# Patient Record
Sex: Female | Born: 1970 | Race: White | Hispanic: No | Marital: Married | Smoking: Current every day smoker
Health system: Southern US, Community
[De-identification: ages and names within clinical notes are randomized; demographics above are authoritative.]

## PROBLEM LIST (undated history)

## (undated) DIAGNOSIS — F419 Anxiety disorder, unspecified: Secondary | ICD-10-CM

## (undated) DIAGNOSIS — K8020 Calculus of gallbladder without cholecystitis without obstruction: Secondary | ICD-10-CM

## (undated) DIAGNOSIS — K219 Gastro-esophageal reflux disease without esophagitis: Secondary | ICD-10-CM

## (undated) DIAGNOSIS — Z3040 Encounter for surveillance of contraceptives, unspecified: Secondary | ICD-10-CM

## (undated) DIAGNOSIS — F32A Depression, unspecified: Secondary | ICD-10-CM

## (undated) DIAGNOSIS — T7840XA Allergy, unspecified, initial encounter: Secondary | ICD-10-CM

## (undated) DIAGNOSIS — Z87440 Personal history of urinary (tract) infections: Secondary | ICD-10-CM

## (undated) DIAGNOSIS — J45909 Unspecified asthma, uncomplicated: Secondary | ICD-10-CM

## (undated) HISTORY — DX: Unspecified asthma, uncomplicated: J45.909

## (undated) HISTORY — PX: ANKLE SURGERY: SHX546

## (undated) HISTORY — DX: Encounter for surveillance of contraceptives, unspecified: Z30.40

## (undated) HISTORY — DX: Allergy, unspecified, initial encounter: T78.40XA

## (undated) HISTORY — PX: WISDOM TOOTH EXTRACTION: SHX21

## (undated) HISTORY — PX: BLADDER SURGERY: SHX569

## (undated) HISTORY — DX: Personal history of urinary (tract) infections: Z87.440

## (undated) HISTORY — PX: DILATION AND CURETTAGE OF UTERUS: SHX78

## (undated) HISTORY — DX: Calculus of gallbladder without cholecystitis without obstruction: K80.20

## (undated) HISTORY — DX: Gastro-esophageal reflux disease without esophagitis: K21.9

---

## 2006-11-30 ENCOUNTER — Ambulatory Visit: Payer: Self-pay | Admitting: Internal Medicine

## 2006-11-30 DIAGNOSIS — F411 Generalized anxiety disorder: Secondary | ICD-10-CM | POA: Insufficient documentation

## 2006-12-01 LAB — CONVERTED CEMR LAB
BUN: 4 mg/dL — ABNORMAL LOW (ref 6–23)
Basophils Relative: 0.7 % (ref 0.0–1.0)
CO2: 25 meq/L (ref 19–32)
GFR calc Af Amer: 122 mL/min
Glucose, Bld: 109 mg/dL — ABNORMAL HIGH (ref 70–99)
HCT: 42.6 % (ref 36.0–46.0)
Hemoglobin: 14.9 g/dL (ref 12.0–15.0)
Lymphocytes Relative: 22 % (ref 12.0–46.0)
Monocytes Absolute: 1.1 10*3/uL — ABNORMAL HIGH (ref 0.2–0.7)
Monocytes Relative: 8 % (ref 3.0–11.0)
Neutro Abs: 9.1 10*3/uL — ABNORMAL HIGH (ref 1.4–7.7)
Neutrophils Relative %: 68 % (ref 43.0–77.0)
Potassium: 3.4 meq/L — ABNORMAL LOW (ref 3.5–5.1)
Sodium: 141 meq/L (ref 135–145)

## 2006-12-12 ENCOUNTER — Encounter: Payer: Self-pay | Admitting: Internal Medicine

## 2006-12-12 ENCOUNTER — Ambulatory Visit: Payer: Self-pay

## 2006-12-19 ENCOUNTER — Ambulatory Visit: Payer: Self-pay | Admitting: Internal Medicine

## 2006-12-19 LAB — CONVERTED CEMR LAB: Beta hcg, urine, semiquantitative: NEGATIVE

## 2006-12-30 ENCOUNTER — Ambulatory Visit: Payer: Self-pay | Admitting: Internal Medicine

## 2006-12-31 ENCOUNTER — Encounter: Payer: Self-pay | Admitting: Internal Medicine

## 2007-01-02 ENCOUNTER — Ambulatory Visit: Payer: Self-pay | Admitting: Internal Medicine

## 2007-01-06 ENCOUNTER — Ambulatory Visit: Payer: Self-pay | Admitting: Internal Medicine

## 2007-01-11 ENCOUNTER — Encounter (INDEPENDENT_AMBULATORY_CARE_PROVIDER_SITE_OTHER): Payer: Self-pay | Admitting: *Deleted

## 2007-01-11 ENCOUNTER — Telehealth: Payer: Self-pay | Admitting: Internal Medicine

## 2007-01-13 ENCOUNTER — Ambulatory Visit: Payer: Self-pay | Admitting: Internal Medicine

## 2007-01-20 ENCOUNTER — Ambulatory Visit: Payer: Self-pay | Admitting: Internal Medicine

## 2007-02-20 ENCOUNTER — Ambulatory Visit: Payer: Self-pay | Admitting: Internal Medicine

## 2007-04-12 ENCOUNTER — Ambulatory Visit: Payer: Self-pay | Admitting: Internal Medicine

## 2007-04-13 ENCOUNTER — Encounter: Payer: Self-pay | Admitting: Internal Medicine

## 2007-04-21 ENCOUNTER — Ambulatory Visit: Payer: Self-pay | Admitting: Internal Medicine

## 2007-05-10 ENCOUNTER — Encounter (INDEPENDENT_AMBULATORY_CARE_PROVIDER_SITE_OTHER): Payer: Self-pay | Admitting: Obstetrics & Gynecology

## 2007-05-10 ENCOUNTER — Ambulatory Visit (HOSPITAL_COMMUNITY): Admission: RE | Admit: 2007-05-10 | Discharge: 2007-05-10 | Payer: Self-pay | Admitting: Obstetrics & Gynecology

## 2007-06-27 ENCOUNTER — Telehealth (INDEPENDENT_AMBULATORY_CARE_PROVIDER_SITE_OTHER): Payer: Self-pay | Admitting: *Deleted

## 2007-06-28 ENCOUNTER — Ambulatory Visit: Payer: Self-pay | Admitting: Internal Medicine

## 2007-06-28 ENCOUNTER — Telehealth (INDEPENDENT_AMBULATORY_CARE_PROVIDER_SITE_OTHER): Payer: Self-pay | Admitting: *Deleted

## 2007-09-18 ENCOUNTER — Telehealth (INDEPENDENT_AMBULATORY_CARE_PROVIDER_SITE_OTHER): Payer: Self-pay | Admitting: *Deleted

## 2008-01-04 ENCOUNTER — Telehealth (INDEPENDENT_AMBULATORY_CARE_PROVIDER_SITE_OTHER): Payer: Self-pay | Admitting: *Deleted

## 2008-03-30 ENCOUNTER — Inpatient Hospital Stay (HOSPITAL_COMMUNITY): Admission: AD | Admit: 2008-03-30 | Discharge: 2008-03-30 | Payer: Self-pay | Admitting: Obstetrics & Gynecology

## 2008-04-02 ENCOUNTER — Inpatient Hospital Stay (HOSPITAL_COMMUNITY): Admission: AD | Admit: 2008-04-02 | Discharge: 2008-04-03 | Payer: Self-pay | Admitting: Obstetrics and Gynecology

## 2008-07-30 ENCOUNTER — Telehealth (INDEPENDENT_AMBULATORY_CARE_PROVIDER_SITE_OTHER): Payer: Self-pay | Admitting: *Deleted

## 2010-06-29 LAB — CBC
HCT: 36.8 % (ref 36.0–46.0)
HCT: 38.1 % (ref 36.0–46.0)
Hemoglobin: 12.9 g/dL (ref 12.0–15.0)
MCHC: 34 g/dL (ref 30.0–36.0)
MCV: 89 fL (ref 78.0–100.0)
MCV: 90.1 fL (ref 78.0–100.0)
RBC: 4.08 MIL/uL (ref 3.87–5.11)
RBC: 4.28 MIL/uL (ref 3.87–5.11)
RDW: 14.1 % (ref 11.5–15.5)
WBC: 20.4 10*3/uL — ABNORMAL HIGH (ref 4.0–10.5)

## 2010-06-29 LAB — RPR: RPR Ser Ql: NONREACTIVE

## 2010-07-28 NOTE — Op Note (Signed)
Hailey Bowman, Hailey Bowman         ACCOUNT NO.:  [id number]   MEDICAL RECORD NO.:  [id number]          PATIENT TYPE:  AMB   LOCATION:  SDC                           FACILITY:  [facility]   PHYSICIAN:  Gerrit Friends. Aldona Bar, M.D.   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   PATHOLOGY:  Pending.   PROCEDURE:  Second trimester suction dilatation and extraction.   SURGEON:  Gerrit Friends. Aldona Bar, M.D.   ANESTHESIA:  Intravenous conscious sedation.   HISTORY:  This 40 year old patient, a primigravida, was seen by me  initially in the office for her first visit approximately 3-4 weeks ago.  She returned on February 23 first trimester testing and on ultrasound  was noted to have bilateral cystic hygromas.  She was sent to do  perinatal for confirmation and genetic counseling.  Decision was  ultimately made to terminate the pregnancy because of suspected  chromosomal anomaly associated with the bilateral cystic hygromas.  Tissue will be sent for chromosomal analysis.  She is taken to the  operating room now for termination of a 12-13 week intrauterine  gestation because of bilateral cystic hygromas and suspected fetal  anomaly - chromosomal anomaly.   PROCEDURE IN DETAIL:  The patient was taken to the operating room where  after satisfactory induction of intravenous conscious sedation she was  prepped and draped having been placed in the modified lithotomy position  in the short Allen stirrups.  As part of the prep the bladder was  drained of clear urine with a red rubber catheter in an in-and-out  fashion.   At this time a  speculum was placed in the vagina and a paracervical  block carried out without difficulty using 18 mL of 1% Xylocaine without  epinephrine.  Thereafter the internal os was gently and systematically  dilated to a #41 Pratt dilator without difficulty.  Thereafter using a  #14 suction curette the cavity was thoroughly gently and systematically  evacuated of all products of conception.  Blood loss was minimal.  Curettage with a large standard curette produced no additional tissue as  did additional probing with a large sponge stick.  Again resuctioning  produced no additional tissue and at this time the procedure was felt to  be complete and was terminated.  The uterus at this time was  approximately 6-8 weeks size and firm and bleeding was minimal.   All instruments were removed and the patient was ultimately taken to the  recovery room in satisfactory condition having tolerated the procedure  well.   ESTIMATED BLOOD LOSS:  About 25 mL.   COUNTS:  All counts correct x2.   PATHOLOGIC SPECIMEN:  Consisted of products of conception and chromosome  analysis will be obtained and appropriate forms were filled out.   The patient will be observed in  recovery.  She will be given a  prescription for doxycycline 100 mg to take twice daily for a total of 5  days to avoid infection and Anaprox DS one every 6-8 hours as needed for  cramping.  She will be asked to return to the office in 2-3 weeks or as  needed.  An instruction sheet will be provided at the time of discharge.  Condition on arrival in the recovery room satisfactory.      Gerrit Friends. Aldona Bar, M.D.  Electronically Signed     RMW/MEDQ  D:  05/10/2007  T:  05/10/2007  Job:  [id number]

## 2010-07-31 NOTE — Discharge Summary (Signed)
NAMEJOLIET, MALLOZZI         ACCOUNT NO.:  [id number]   MEDICAL RECORD NO.:  [id number]          PATIENT TYPE:  INP   LOCATION:  9134                          FACILITY:  [facility]   PHYSICIAN:  Ilda Mori, M.D.   DATE OF BIRTH:  [birth year]   DATE OF ADMISSION:  04/02/2008  DATE OF DISCHARGE:  04/03/2008                               DISCHARGE SUMMARY   FINAL DIAGNOSES:  1. Intrauterine pregnancy at 40 weeks' gestation.  2. Active labor.  3. Maternal fatigue.   PROCEDURE:  Vacuum-assisted vaginal delivery of a female infant with  Apgars of 8 and 9.   Delivery was performed by Dr. Ilda Mori.   COMPLICATIONS:  None.   This is a 40 year old, G2, P 0-0-1-0 presents at term in early active  labor.  The patient's antepartum course up to this point had been  uncomplicated.  The patient presents in active labor.  She dilated to  complete and complete and pushed for over an hour and half.  Because of  this maternal exhaustion, a vacuum was used to help facilitate delivery.  The patient delivered with a second contraction with vacuum assistance.  She had the delivery of a 7 pounds 15 ounces female infant with Apgars  of 8 and 9.  There was a second-degree vaginal laceration at the  introitus that was repaired.  There was some mild shoulder dystocia that  was relieved with the McRoberts and delivery of the posterior shoulder  otherwise, no complications.  The patient's postpartum course was benign  without any significant fevers.  She was felt ready for discharge on  postpartum day #1 she was sent home on a regular diet, told to decrease  activities, told to continue her vitamins, was given Motrin, told she  could use up to 60 to 100 mg every 60 hours as needed for her pain was  to follow up in our office in 6 weeks.  Instructions and precautions  were reviewed with the patient.   LABS ON DISCHARGE:  The patient had a hemoglobin of 12.3, white blood  cell count of 20.4, and  platelet count of 263,000.      Leilani Able, P.A.-C.      Ilda Mori, M.D.  Electronically Signed    MB/MEDQ  D:  05/10/2008  T:  05/10/2008  Job:  [id number]

## 2010-12-04 LAB — CBC
HCT: 43.2
Hemoglobin: 15.1 — ABNORMAL HIGH
MCV: 88.4
Platelets: 346
RDW: 13.3

## 2011-01-19 ENCOUNTER — Other Ambulatory Visit: Payer: Self-pay | Admitting: Obstetrics & Gynecology

## 2012-03-09 ENCOUNTER — Encounter: Payer: Self-pay | Admitting: Internal Medicine

## 2012-03-09 ENCOUNTER — Ambulatory Visit (INDEPENDENT_AMBULATORY_CARE_PROVIDER_SITE_OTHER): Payer: Managed Care, Other (non HMO) | Admitting: Internal Medicine

## 2012-03-09 VITALS — BP 126/84 | HR 70 | Temp 98.1°F | Wt 271.0 lb

## 2012-03-09 DIAGNOSIS — Z720 Tobacco use: Secondary | ICD-10-CM | POA: Insufficient documentation

## 2012-03-09 DIAGNOSIS — H6690 Otitis media, unspecified, unspecified ear: Secondary | ICD-10-CM | POA: Insufficient documentation

## 2012-03-09 DIAGNOSIS — J45909 Unspecified asthma, uncomplicated: Secondary | ICD-10-CM

## 2012-03-09 DIAGNOSIS — F17200 Nicotine dependence, unspecified, uncomplicated: Secondary | ICD-10-CM

## 2012-03-09 MED ORDER — AZITHROMYCIN 250 MG PO TABS: ORAL_TABLET | ORAL | Status: DC

## 2012-03-09 MED ORDER — ALBUTEROL SULFATE HFA 108 (90 BASE) MCG/ACT IN AERS: 2.0000 | INHALATION_SPRAY | Freq: Four times a day (QID) | RESPIRATORY_TRACT | Status: DC | PRN

## 2012-03-09 NOTE — Progress Notes (Signed)
  Subjective:    Patient ID: Hailey Bowman, female    DOB: [birth year], 41 y.o.   MRN: [id number]  HPI New patient, last visit more than 3 years ago. Developed URI a week ago, he had fever, headaches, she thought had the flu. In the last two days has developed right ear pain.   Past Medical History  Diagnosis Date  . Asthma   . History of UTI     as a child    Past Surgical History  Procedure Date  . Dilation and curettage of uterus    History   Social History  . Marital Status: Married    Spouse Name: N/A    Number of Children: 1  . Years of Education: N/A   Occupational History  . [employer] part time     Social History Main Topics  . Smoking status: Current Every Day Smoker  . Smokeless tobacco: Never Used     Comment: 1 ppd  . Alcohol Use: Yes     Comment: socially   . Drug Use: No  . Sexually Active: Not on file   Other Topics Concern  . Not on file   Social History Narrative   G2P1   Family History  Problem Relation Age of Onset  . Breast cancer Mother   . Colon cancer Neg Hx   . Diabetes      GF?  . CAD      GM CHF,CAD    Review of Systems She has a remote history of asthma, currently not wheezing, has some cough. He has a old ventolin and would like a refill. No nausea, vomiting, diarrhea. Needs counseling about tobacco. See assessment and plan.    Objective:   Physical Exam General -- alert, well-developed  HEENT --   left tympanic membrane normal, right tympanic membrane: Flat, red. Canal is free of discharge or swelling. Throat without redness or discharge. Nose slightly congested Lungs -- normal respiratory effort, no intercostal retractions, no accessory muscle use, and normal breath sounds.   Heart-- normal rate, regular rhythm, no murmur, and no gallop.  No lower extremity edema Psych-- Cognition and judgment appear intact. Alert and cooperative with normal attention span and concentration.  not anxious appearing and not depressed  appearing.      Assessment & Plan:

## 2012-03-09 NOTE — Assessment & Plan Note (Signed)
Findings consistent with otitis media, she is allergic to penicillin, prescribed Z-Pak

## 2012-03-09 NOTE — Patient Instructions (Addendum)
Take the antibiotic as prescribed  (zithromax) Uses inhaler as needed for asthma, if you use a healing or more often than 3 or 4 times a week let me know. Call if no better in few days Call anytime if the symptoms are severe Come back at your convenience for a checkup

## 2012-03-09 NOTE — Assessment & Plan Note (Signed)
Interested in quitting. We discussed different modalities to help her  including Wellbutrin, nicotine supplementation and Chantix. I'm somehow concerned about the fact that Chantix is  pregnancy category C and she's not taking any birth control. Plan: Information provided regards quitting provided ? nicotine supplementation

## 2012-03-09 NOTE — Assessment & Plan Note (Signed)
Currently doing okay, prescribe ventolin as needed. See instructions

## 2012-07-18 ENCOUNTER — Other Ambulatory Visit: Payer: Self-pay | Admitting: *Deleted

## 2012-07-18 ENCOUNTER — Telehealth: Payer: Self-pay | Admitting: General Practice

## 2012-07-18 MED ORDER — ALBUTEROL SULFATE HFA 108 (90 BASE) MCG/ACT IN AERS: 2.0000 | INHALATION_SPRAY | Freq: Four times a day (QID) | RESPIRATORY_TRACT | Status: DC | PRN

## 2012-07-18 NOTE — Telephone Encounter (Signed)
Insurance company states [place] needed for Ventolin. Provider and pt ok with switching to ProAir inhaler that insurance covers. Chart updated to show this [facility]

## 2012-07-18 NOTE — Telephone Encounter (Signed)
Rx sent Discuss with patient   

## 2013-04-18 ENCOUNTER — Other Ambulatory Visit: Payer: Self-pay | Admitting: Internal Medicine

## 2013-04-25 ENCOUNTER — Other Ambulatory Visit: Payer: Self-pay | Admitting: *Deleted

## 2013-04-25 MED ORDER — ALBUTEROL SULFATE HFA 108 (90 BASE) MCG/ACT IN AERS: 2.0000 | INHALATION_SPRAY | Freq: Four times a day (QID) | RESPIRATORY_TRACT | Status: DC | PRN

## 2013-08-15 ENCOUNTER — Telehealth: Payer: Self-pay | Admitting: Internal Medicine

## 2013-08-15 ENCOUNTER — Encounter: Payer: Self-pay | Admitting: Internal Medicine

## 2013-08-15 ENCOUNTER — Ambulatory Visit (INDEPENDENT_AMBULATORY_CARE_PROVIDER_SITE_OTHER): Payer: Managed Care, Other (non HMO) | Admitting: Internal Medicine

## 2013-08-15 VITALS — BP 134/84 | HR 100 | Temp 98.9°F | Wt 282.0 lb

## 2013-08-15 DIAGNOSIS — J329 Chronic sinusitis, unspecified: Secondary | ICD-10-CM

## 2013-08-15 MED ORDER — AZITHROMYCIN 250 MG PO TABS: ORAL_TABLET | ORAL | Status: DC

## 2013-08-15 MED ORDER — GENTAMICIN FORTIFIED OPHTHALMIC SOLUTION: OPHTHALMIC | Status: DC

## 2013-08-15 NOTE — Telephone Encounter (Signed)
Relevant patient education mailed to patient.  

## 2013-08-15 NOTE — Patient Instructions (Signed)
Rest, fluids , tylenol  For cough, take Mucinex DM twice a day as needed   For congestion use OTC Nasocort: 2 nasal sprays on each side of the nose daily until you feel better  Use the eye drops x 5 days  Take the antibiotic as prescribed  (zithromax)  Call if no better in few days  Call anytime if the symptoms are severe or you have problems with your vision,  the eye gets more red-swollen

## 2013-08-15 NOTE — Progress Notes (Signed)
Subjective:    Patient ID: Hailey Bowman, female    DOB: 1971/03/10, 43 y.o.   MRN: [id number]  DOS:  08/15/2013 Type of  Visit: acute  History: Symptoms started 4 days ago with a cold, runny nose, sore throat. She did have some wheezing and used albuterol as needed. Chest symptoms are better but since yesterday she is having more sinus congestion and pain particularly on the left side. This morning the left eye was swollen and red. No eye pain per se. This afternoon the eye looks better   ROS No fever or chills Mild ear congestion and discomfort on the right. No nausea, vomiting, diarrhea. Mild cough 2 days ago. Vision is normal. Not on BCP, last menstrual period about 2 weeks ago  Past Medical History  Diagnosis Date  . Asthma   . History of UTI     as a child   . Contraceptive use     condoms    Past Surgical History  Procedure Laterality Date  . Dilation and curettage of uterus      History   Social History  . Marital Status: Married    Spouse Name: N/A    Number of Children: 1  . Years of Education: N/A   Occupational History  . not working at present    Social History Main Topics  . Smoking status: Current Every Day Smoker  . Smokeless tobacco: Never Used     Comment: 1 ppd  . Alcohol Use: Yes     Comment: socially   . Drug Use: No  . Sexual Activity: Not on file   Other Topics Concern  . Not on file   Social History Narrative   G2P1        Medication List       This list is accurate as of: 08/15/13 11:59 PM.  Always use your most recent med list.               albuterol 108 (90 BASE) MCG/ACT inhaler  Commonly known as:  PROAIR HFA  Inhale 2 puffs into the lungs every 6 (six) hours as needed for wheezing or shortness of breath.     azithromycin 250 MG tablet  Commonly known as:  ZITHROMAX Z-PAK  As directed     GENTAMICIN FORTIFIED OPHTHALMIC SOLUTION  3 drops, L eye , 4 times a day x 5 days           Objective:   Physical Exam BP 134/84  Pulse 100  Temp(Src) 98.9 F (37.2 C)  Wt 282 lb (127.914 kg)  SpO2 96% General -- alert, well-developed, NAD.   HEENT-- Not pale. TMs L normal, R slt red, no d/c; throat symmetric, no redness or discharge.  Face symmetric, sinuses   tender to palpation B. Nose moderately  congested.  EOMI, PERLA  , ant chambers normal, no photiphobia noted  conjuntiva slt red on the L Lungs -- normal respiratory effort, no intercostal retractions, no accessory muscle use, and few end exp wheezes (minimal) Heart-- normal rate, regular rhythm, no murmur.  Neurologic--  alert & oriented X3. Speech normal, gait appropriate for age, strength symmetric and appropriate for age.  Psych-- Cognition and judgment appear intact. Cooperative with normal attention span and concentration. No anxious or depressed appearing.        Assessment & Plan:   Sinusitis, Symptoms consistent with sinusitis, the left eye is slightly red and  the eyes lids slightly swollen but the eye exam  itself is normal, do not suspect an acute eye problem at this time. R TM slt red but looks the same as during previous visit. Plan: Treat with Zithromax, eye drops , see instructions

## 2013-08-15 NOTE — Progress Notes (Signed)
Pre visit review using our clinic review tool, if applicable. No additional management support is needed unless otherwise documented below in the visit note. 

## 2013-08-23 ENCOUNTER — Ambulatory Visit (INDEPENDENT_AMBULATORY_CARE_PROVIDER_SITE_OTHER): Payer: Managed Care, Other (non HMO) | Admitting: Family Medicine

## 2013-08-23 ENCOUNTER — Encounter: Payer: Self-pay | Admitting: Family Medicine

## 2013-08-23 VITALS — BP 130/82 | HR 96 | Temp 98.9°F | Resp 17 | Wt 281.1 lb

## 2013-08-23 DIAGNOSIS — J309 Allergic rhinitis, unspecified: Secondary | ICD-10-CM

## 2013-08-23 NOTE — Progress Notes (Signed)
Pre visit review using our clinic review tool, if applicable. No additional management support is needed unless otherwise documented below in the visit note. 

## 2013-08-23 NOTE — Assessment & Plan Note (Signed)
No evidence of persistent or recurrent OM.  Pt w/ evidence of allergic rhinitis on PE.  Start OTC antihistamine daily.  Reviewed supportive care and red flags that should prompt return.  Pt expressed understanding and is in agreement w/ plan.

## 2013-08-23 NOTE — Patient Instructions (Addendum)
Follow up as needed Take the Zyrtec daily for 1-2 weeks to get ahead of the allergic inflammation The ear looks great! Drink plenty of fluids Tylenol as needed REST! Call with any questions or concerns Happy Early Birthday!!!

## 2013-08-23 NOTE — Progress Notes (Signed)
   Subjective:    Patient ID: Hailey Bowman, female    DOB: November 06, 1970, 43 y.o.   MRN: [id number]  HPI URI- pt saw Dr Drue Novel on 6/3 for sinusitis, R OM.  Last night noted R sided 'knot- the lymph node was really swollen'.  This worried pt that she may still have residual infxn.  sxs improved w/ tylenol.  No fevers.  Facial pain and pressure as resolved.  Not currently taking Zyrtec.   Review of Systems For ROS see HPI     Objective:   Physical Exam  Vitals reviewed. Constitutional: She appears well-developed and well-nourished. No distress.  HENT:  Head: Normocephalic and atraumatic.  Right Ear: Tympanic membrane normal.  Left Ear: Tympanic membrane normal.  Nose: Mucosal edema and rhinorrhea present. Right sinus exhibits no maxillary sinus tenderness and no frontal sinus tenderness. Left sinus exhibits no maxillary sinus tenderness and no frontal sinus tenderness.  Mouth/Throat: Mucous membranes are normal. Posterior oropharyngeal erythema (w/ PND) present.  Eyes: Conjunctivae and EOM are normal. Pupils are equal, round, and reactive to light.  Neck: Normal range of motion. Neck supple.  Cardiovascular: Normal rate, regular rhythm and normal heart sounds.   Pulmonary/Chest: Effort normal and breath sounds normal. No respiratory distress. She has no wheezes. She has no rales.  Lymphadenopathy:       Head (right side): Tonsillar (sub centimeter, non tender) adenopathy present.    She has no cervical adenopathy.          Assessment & Plan:

## 2013-10-19 ENCOUNTER — Other Ambulatory Visit: Payer: Self-pay | Admitting: Internal Medicine

## 2014-01-17 ENCOUNTER — Other Ambulatory Visit: Payer: Self-pay | Admitting: Internal Medicine

## 2014-01-17 ENCOUNTER — Telehealth: Payer: Self-pay

## 2014-01-17 NOTE — Telephone Encounter (Signed)
Hailey Bowman [phone] [facility]  [name] needs her PROAIR HFA 108 (90 BASE) MCG/ACT inhaler, hers has disappeared and she thinks that her daughter may have thrown it away. She is going to mountains this weekend and does not want to run out.

## 2014-01-17 NOTE — Telephone Encounter (Signed)
Spoke with Pt informed her that [facility]

## 2014-05-02 ENCOUNTER — Other Ambulatory Visit: Payer: Self-pay | Admitting: Internal Medicine

## 2014-06-28 ENCOUNTER — Other Ambulatory Visit: Payer: Self-pay | Admitting: Internal Medicine

## 2014-07-01 ENCOUNTER — Telehealth: Payer: Self-pay | Admitting: Internal Medicine

## 2014-07-01 ENCOUNTER — Other Ambulatory Visit: Payer: Self-pay

## 2014-07-01 MED ORDER — ALBUTEROL SULFATE HFA 108 (90 BASE) MCG/ACT IN AERS: 2.0000 | INHALATION_SPRAY | Freq: Four times a day (QID) | RESPIRATORY_TRACT | Status: DC | PRN

## 2014-07-01 NOTE — Telephone Encounter (Signed)
[facility]  

## 2014-07-01 NOTE — Telephone Encounter (Signed)
Relation to pt: self  Call back number: [phone] Pharmacy: [id number] - [place], [place] - [address] [phone] (Phone) [phone] (Fax)         Reason for call:  Pt requesting a refill albuterol (PROAIR HFA) 108 (90 BASE) MCG/ACT inhaler. Pt scheduled an appointment for 07/16/2014

## 2014-07-16 ENCOUNTER — Ambulatory Visit (INDEPENDENT_AMBULATORY_CARE_PROVIDER_SITE_OTHER): Payer: Managed Care, Other (non HMO) | Admitting: Internal Medicine

## 2014-07-16 ENCOUNTER — Encounter: Payer: Self-pay | Admitting: Internal Medicine

## 2014-07-16 VITALS — BP 124/68 | HR 87 | Temp 98.2°F | Ht 69.0 in | Wt 274.4 lb

## 2014-07-16 DIAGNOSIS — J4520 Mild intermittent asthma, uncomplicated: Secondary | ICD-10-CM | POA: Diagnosis not present

## 2014-07-16 DIAGNOSIS — Z720 Tobacco use: Secondary | ICD-10-CM

## 2014-07-16 MED ORDER — ALBUTEROL SULFATE HFA 108 (90 BASE) MCG/ACT IN AERS: 2.0000 | INHALATION_SPRAY | Freq: Four times a day (QID) | RESPIRATORY_TRACT | Status: DC | PRN

## 2014-07-16 MED ORDER — BUPROPION HCL 100 MG PO TABS: 100.0000 mg | ORAL_TABLET | Freq: Two times a day (BID) | ORAL | Status: AC

## 2014-07-16 NOTE — Progress Notes (Signed)
Pre visit review using our clinic review tool, if applicable. No additional management support is needed unless otherwise documented below in the visit note. 

## 2014-07-16 NOTE — Patient Instructions (Signed)
Quitting tobacco Start Wellbutrin: 1 tablet every day for the next 10 days, then increase to one tablet twice a day Quit tobacco 2 or 3 weeks after you start Wellbutrin You cannot take Wellbutrin if you get pregnant, get a urinary pregnancy test or  call the office immediately if you suspect you may be pregnant Please visit American Heart Association website for good information about quitting tobacco.   Asthma, Continue taking albuterol as needed, if within 2 weeks the need to use albuterol has not decrease: Please call the office   Come back in 2 months, fasting for a physical exam. Make an appointment at the front desk.

## 2014-07-16 NOTE — Progress Notes (Signed)
Subjective:    Patient ID: Hailey ShorterMichelle Amstutz, female    DOB: 16-Dec-1970, 44 y.o.   MRN: [id number]  DOS:  07/16/2014 Type of visit - description : rov Interval history:  Patient is a 44 year old female with a history of allergic rhinitis, asthma, and tobacco use in today for a follow up visit for refill on albuterol.  Patient has been using albuterol for asthma relief. Her baseline is using it once every two days, but this has increased to twice per day due to her seasonal allergies. She notes this typically happens in both spring and fall where allergies or URIs result in increased albuterol use, but that usage recedes back to baseline afterwards. She denies any increased shortness of breath or chest pain. No constitutional symptoms of infection.  Patient also expresses an interest in smoking cessation. She is currently a 1 ppd smoker and has tried various interventions in the past. She has tried e-cigarettes and nicotine patches with no relief. Patient notes mild help with Wellbutrin in the past and would like to try again.    Review of Systems  Constitutional: No fever, chills.  Respiratory: As per HPI Cardiovascular: No CP, leg swelling or palpitations GI: no nausea, vomiting, diarrhea or abdominal pain.  GU: No dysuria, gross hematuria, difficulty urinating. No urinary urgency or frequency. Allergic, immunologic: Seasonal allergic rhinitis  Past Medical History  Diagnosis Date  . Asthma   . History of UTI     as a child   . Contraceptive use     condoms    Past Surgical History  Procedure Laterality Date  . Dilation and curettage of uterus      History   Social History  . Marital Status: Married    Spouse Name: N/A  . Number of Children: 1  . Years of Education: N/A   Occupational History  . not working at present    Social History Main Topics  . Smoking status: Current Every Day Smoker  . Smokeless tobacco: Never Used     Comment: 1 ppd  . Alcohol Use: Yes       Comment: socially   . Drug Use: No  . Sexual Activity: Not on file   Other Topics Concern  . Not on file   Social History Narrative   G2P1     Family History  Problem Relation Age of Onset  . Breast cancer Mother   . Colon cancer Neg Hx   . Diabetes      GF?  . CAD      GM CHF,CAD       Medication List       This list is accurate as of: 07/16/14 11:59 PM.  Always use your most recent med list.               albuterol 108 (90 BASE) MCG/ACT inhaler  Commonly known as:  PROAIR HFA  Inhale 2 puffs into the lungs every 6 (six) hours as needed for wheezing or shortness of breath.     buPROPion 100 MG tablet  Commonly known as:  WELLBUTRIN  Take 1 tablet (100 mg total) by mouth 2 (two) times daily.           Objective:   Physical Exam BP 124/68 mmHg  Pulse 87  Temp(Src) 98.2 F (36.8 C) (Oral)  Ht 5\' 9"  (1.753 m)  Wt 274 lb 6 oz (124.456 kg)  BMI 40.50 kg/m2  SpO2 97%  LMP 07/08/2014 (Approximate)  General:   Well developed, well nourished . NAD.  HEENT:  Normocephalic . Face symmetric, atraumatic Lungs:  CTA B Normal respiratory effort, no intercostal retractions, no accessory muscle use. Heart: RRR,  no murmur.  Muscle skeletal: no pretibial edema bilaterally  Skin: Not pale. Not jaundice Neurologic:  alert & oriented X3.  Speech normal, gait appropriate for age and unassisted Psych--  Cognition and judgment appear intact.  Cooperative with normal attention span and concentration.  Behavior appropriate. No anxious or depressed appearing.       Assessment & Plan:   (Patient seen along with   Merilynn Finland, medical student)  Today , I spent more than  25  min with the patient: >50% of the time counseling regards tobacco cessation

## 2014-07-17 NOTE — Assessment & Plan Note (Signed)
Patient is currently using albuterol twice a day, which is more than ideal. Allergies are exacerbating her symptoms above her baseline  Plan: Continue Albuterol prn.  If symptoms do not improve soon we will consider prescribing an additional medication

## 2014-07-17 NOTE — Assessment & Plan Note (Signed)
amoking Cessation: Patient has tried e-cigarettes and nicotine patches in the past with no relief.  Wellbutrin is a pregnancy Class C drug and patient is aware, her current contraception is condoms, LMP a week ago.  Plan: Will Rx Wellbutrin, patient understands importance of condom usage. Patient explained usage of building up wellbutrin for 2-3 weeks and then stopping smoking.  Patient informed she can combine with nicotine patches/gum for relief.  Patient counseled on importance of identifying rituals surrounding smoking and replacing them with healthy distractions.  AHA website provided to patient as resource.  Patient instructed to return in two months for physical exam and laboratory bloodwork.

## 2015-01-10 ENCOUNTER — Other Ambulatory Visit: Payer: Self-pay | Admitting: Internal Medicine

## 2015-02-27 ENCOUNTER — Other Ambulatory Visit: Payer: Self-pay | Admitting: Internal Medicine

## 2018-10-10 ENCOUNTER — Encounter: Payer: Self-pay | Admitting: Internal Medicine

## 2019-09-17 ENCOUNTER — Other Ambulatory Visit: Payer: Self-pay

## 2019-09-17 ENCOUNTER — Encounter (HOSPITAL_BASED_OUTPATIENT_CLINIC_OR_DEPARTMENT_OTHER): Payer: Self-pay

## 2019-09-17 DIAGNOSIS — R9431 Abnormal electrocardiogram [ECG] [EKG]: Secondary | ICD-10-CM | POA: Diagnosis not present

## 2019-09-17 DIAGNOSIS — F17210 Nicotine dependence, cigarettes, uncomplicated: Secondary | ICD-10-CM | POA: Diagnosis not present

## 2019-09-17 DIAGNOSIS — J45909 Unspecified asthma, uncomplicated: Secondary | ICD-10-CM | POA: Insufficient documentation

## 2019-09-17 DIAGNOSIS — R112 Nausea with vomiting, unspecified: Secondary | ICD-10-CM | POA: Diagnosis not present

## 2019-09-17 DIAGNOSIS — K760 Fatty (change of) liver, not elsewhere classified: Secondary | ICD-10-CM | POA: Diagnosis not present

## 2019-09-17 DIAGNOSIS — R197 Diarrhea, unspecified: Secondary | ICD-10-CM | POA: Diagnosis not present

## 2019-09-17 DIAGNOSIS — R110 Nausea: Secondary | ICD-10-CM | POA: Diagnosis not present

## 2019-09-17 DIAGNOSIS — R1013 Epigastric pain: Secondary | ICD-10-CM | POA: Diagnosis not present

## 2019-09-17 DIAGNOSIS — K8020 Calculus of gallbladder without cholecystitis without obstruction: Secondary | ICD-10-CM | POA: Diagnosis not present

## 2019-09-17 LAB — CBC
HCT: 46.6 % — ABNORMAL HIGH (ref 36.0–46.0)
Hemoglobin: 15.6 g/dL — ABNORMAL HIGH (ref 12.0–15.0)
MCH: 29.8 pg (ref 26.0–34.0)
MCHC: 33.5 g/dL (ref 30.0–36.0)
MCV: 89.1 fL (ref 80.0–100.0)
Platelets: 317 10*3/uL (ref 150–400)
RBC: 5.23 MIL/uL — ABNORMAL HIGH (ref 3.87–5.11)
RDW: 14.1 % (ref 11.5–15.5)
WBC: 18.9 10*3/uL — ABNORMAL HIGH (ref 4.0–10.5)
nRBC: 0 % (ref 0.0–0.2)

## 2019-09-17 LAB — COMPREHENSIVE METABOLIC PANEL
ALT: 22 U/L (ref 0–44)
AST: 25 U/L (ref 15–41)
Albumin: 4.2 g/dL (ref 3.5–5.0)
Alkaline Phosphatase: 56 U/L (ref 38–126)
Anion gap: 13 (ref 5–15)
BUN: 16 mg/dL (ref 6–20)
CO2: 21 mmol/L — ABNORMAL LOW (ref 22–32)
Calcium: 8.6 mg/dL — ABNORMAL LOW (ref 8.9–10.3)
Chloride: 103 mmol/L (ref 98–111)
Creatinine, Ser: 0.72 mg/dL (ref 0.44–1.00)
GFR calc Af Amer: >60 mL/min (ref >60)
GFR calc non Af Amer: >60 mL/min (ref >60)
Glucose, Bld: 139 mg/dL — ABNORMAL HIGH (ref 70–99)
Potassium: 3.1 mmol/L — ABNORMAL LOW (ref 3.5–5.1)
Sodium: 137 mmol/L (ref 135–145)
Total Bilirubin: 1.4 mg/dL — ABNORMAL HIGH (ref 0.3–1.2)
Total Protein: 8.1 g/dL (ref 6.5–8.1)

## 2019-09-17 LAB — LIPASE, BLOOD: Lipase: 21 U/L (ref 11–51)

## 2019-09-17 MED ORDER — SODIUM CHLORIDE 0.9% FLUSH
3.0000 mL | Freq: Once | INTRAVENOUS | Status: DC
  Filled 2019-09-17: qty 3

## 2019-09-17 NOTE — ED Triage Notes (Addendum)
Pt c/o epigastric pain, n/v, constipation started 7/3-states she feels better then sx returned after BM-NAD-steady gait

## 2019-09-18 ENCOUNTER — Encounter (HOSPITAL_BASED_OUTPATIENT_CLINIC_OR_DEPARTMENT_OTHER): Payer: Self-pay | Admitting: *Deleted

## 2019-09-18 ENCOUNTER — Other Ambulatory Visit: Payer: Self-pay

## 2019-09-18 ENCOUNTER — Emergency Department (HOSPITAL_BASED_OUTPATIENT_CLINIC_OR_DEPARTMENT_OTHER): Payer: BC Managed Care – PPO

## 2019-09-18 ENCOUNTER — Emergency Department (HOSPITAL_BASED_OUTPATIENT_CLINIC_OR_DEPARTMENT_OTHER)
Admission: EM | Admit: 2019-09-18 | Discharge: 2019-09-18 | Disposition: A | Discharge status: Home / Self Care | Payer: BC Managed Care – PPO | Attending: Emergency Medicine | Admitting: Emergency Medicine

## 2019-09-18 ENCOUNTER — Emergency Department (HOSPITAL_BASED_OUTPATIENT_CLINIC_OR_DEPARTMENT_OTHER)
Admission: EM | Admit: 2019-09-18 | Discharge: 2019-09-18 | Disposition: A | Discharge status: Home / Self Care | Payer: BC Managed Care – PPO | Source: Home / Self Care | Attending: Emergency Medicine | Admitting: Emergency Medicine

## 2019-09-18 DIAGNOSIS — R110 Nausea: Secondary | ICD-10-CM

## 2019-09-18 DIAGNOSIS — R1013 Epigastric pain: Secondary | ICD-10-CM | POA: Diagnosis not present

## 2019-09-18 DIAGNOSIS — K8020 Calculus of gallbladder without cholecystitis without obstruction: Secondary | ICD-10-CM | POA: Diagnosis not present

## 2019-09-18 DIAGNOSIS — K760 Fatty (change of) liver, not elsewhere classified: Secondary | ICD-10-CM | POA: Diagnosis not present

## 2019-09-18 DIAGNOSIS — R112 Nausea with vomiting, unspecified: Secondary | ICD-10-CM

## 2019-09-18 HISTORY — DX: Anxiety disorder, unspecified: F41.9

## 2019-09-18 HISTORY — DX: Depression, unspecified: F32.A

## 2019-09-18 LAB — URINALYSIS, ROUTINE W REFLEX MICROSCOPIC
Glucose, UA: NEGATIVE mg/dL
Ketones, ur: 40 mg/dL — AB
Leukocytes,Ua: NEGATIVE
Nitrite: NEGATIVE
Protein, ur: NEGATIVE mg/dL
Specific Gravity, Urine: 1.025 (ref 1.005–1.030)
pH: 6 (ref 5.0–8.0)

## 2019-09-18 LAB — COMPREHENSIVE METABOLIC PANEL
ALT: 23 U/L (ref 0–44)
AST: 24 U/L (ref 15–41)
Albumin: 4.2 g/dL (ref 3.5–5.0)
Alkaline Phosphatase: 55 U/L (ref 38–126)
Anion gap: 11 (ref 5–15)
BUN: 14 mg/dL (ref 6–20)
CO2: 25 mmol/L (ref 22–32)
Calcium: 8.9 mg/dL (ref 8.9–10.3)
Chloride: 103 mmol/L (ref 98–111)
Creatinine, Ser: 0.8 mg/dL (ref 0.44–1.00)
GFR calc Af Amer: >60 mL/min (ref >60)
GFR calc non Af Amer: >60 mL/min (ref >60)
Glucose, Bld: 121 mg/dL — ABNORMAL HIGH (ref 70–99)
Potassium: 3.3 mmol/L — ABNORMAL LOW (ref 3.5–5.1)
Sodium: 139 mmol/L (ref 135–145)
Total Bilirubin: 1.6 mg/dL — ABNORMAL HIGH (ref 0.3–1.2)
Total Protein: 7.9 g/dL (ref 6.5–8.1)

## 2019-09-18 LAB — LIPASE, BLOOD: Lipase: 23 U/L (ref 11–51)

## 2019-09-18 LAB — CBC WITH DIFFERENTIAL/PLATELET
Abs Immature Granulocytes: 0.07 10*3/uL (ref 0.00–0.07)
Basophils Absolute: 0.1 10*3/uL (ref 0.0–0.1)
Basophils Relative: 1 %
Eosinophils Absolute: 0.1 10*3/uL (ref 0.0–0.5)
Eosinophils Relative: 0 %
HCT: 44.7 % (ref 36.0–46.0)
Hemoglobin: 15.1 g/dL — ABNORMAL HIGH (ref 12.0–15.0)
Immature Granulocytes: 1 %
Lymphocytes Relative: 21 %
Lymphs Abs: 3.2 10*3/uL (ref 0.7–4.0)
MCH: 30.3 pg (ref 26.0–34.0)
MCHC: 33.8 g/dL (ref 30.0–36.0)
MCV: 89.6 fL (ref 80.0–100.0)
Monocytes Absolute: 1.3 10*3/uL — ABNORMAL HIGH (ref 0.1–1.0)
Monocytes Relative: 9 %
Neutro Abs: 10.8 10*3/uL — ABNORMAL HIGH (ref 1.7–7.7)
Neutrophils Relative %: 68 %
Platelets: 297 10*3/uL (ref 150–400)
RBC: 4.99 MIL/uL (ref 3.87–5.11)
RDW: 14.2 % (ref 11.5–15.5)
WBC: 15.5 10*3/uL — ABNORMAL HIGH (ref 4.0–10.5)
nRBC: 0 % (ref 0.0–0.2)

## 2019-09-18 LAB — URINALYSIS, MICROSCOPIC (REFLEX)

## 2019-09-18 LAB — PREGNANCY, URINE: Preg Test, Ur: NEGATIVE

## 2019-09-18 IMAGING — US US ABDOMEN LIMITED
1 series · 14 of 25 positions shown · non-contrast
Comparison: None.

CLINICAL DATA: Right upper quadrant pain

EXAM:
ULTRASOUND ABDOMEN LIMITED RIGHT UPPER QUADRANT

[Series 1: us abdomen limited · 37 acquisitions, 14 frames shown]
[im 1/37]
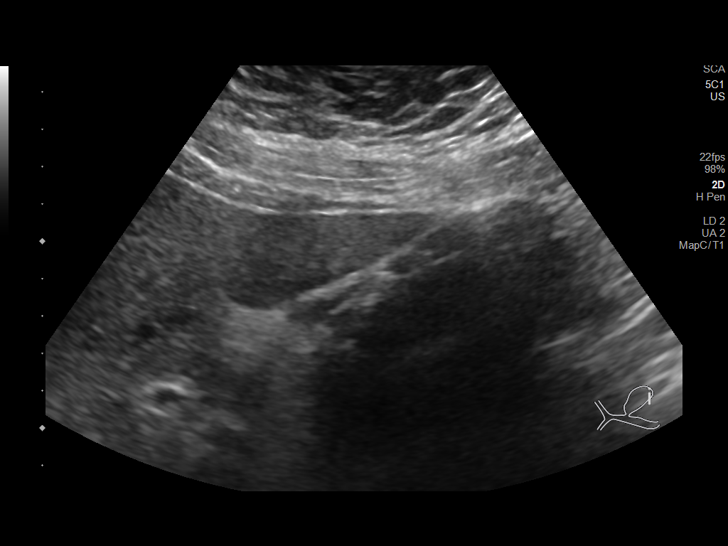
[im 4/37]
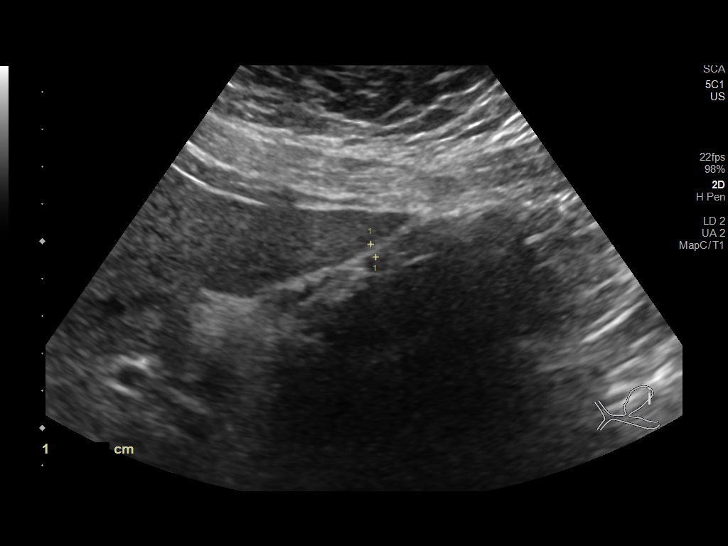
[im 7/37]
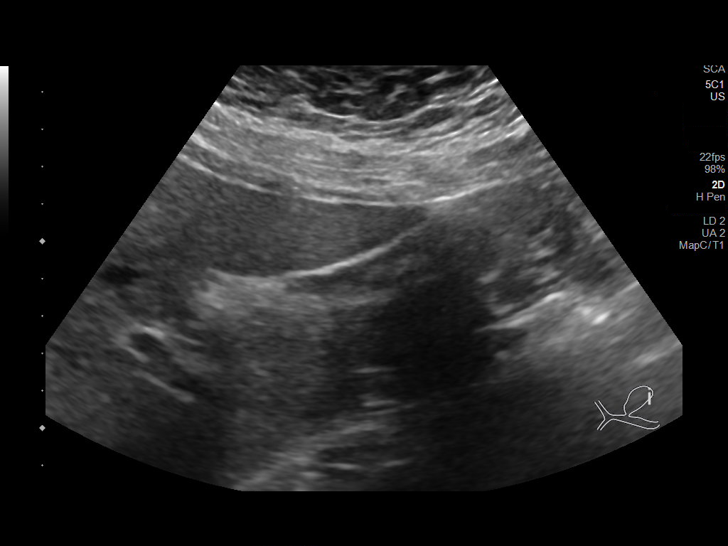
[im 10/37]
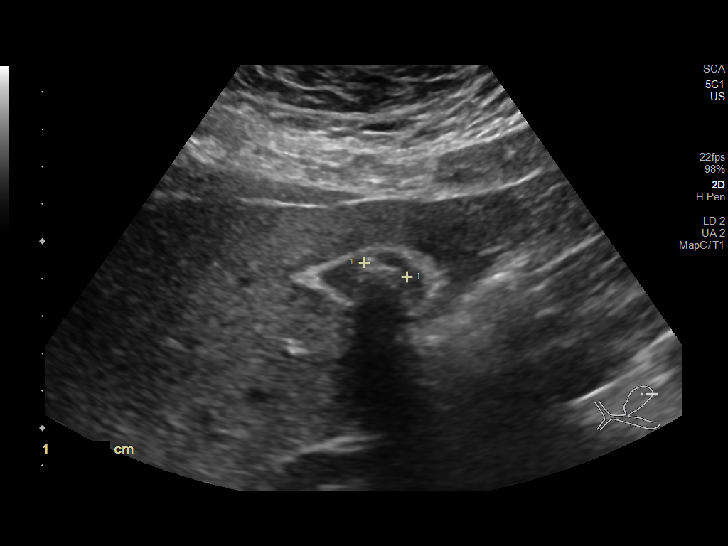
[im 13/37]
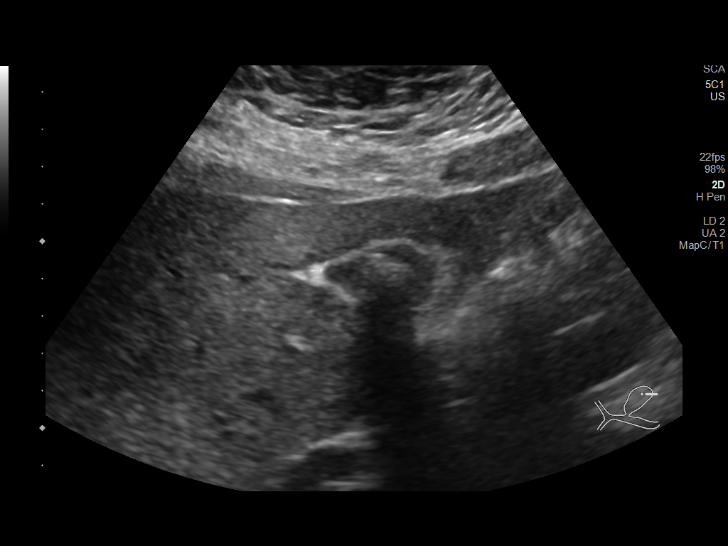
[im 14/37]
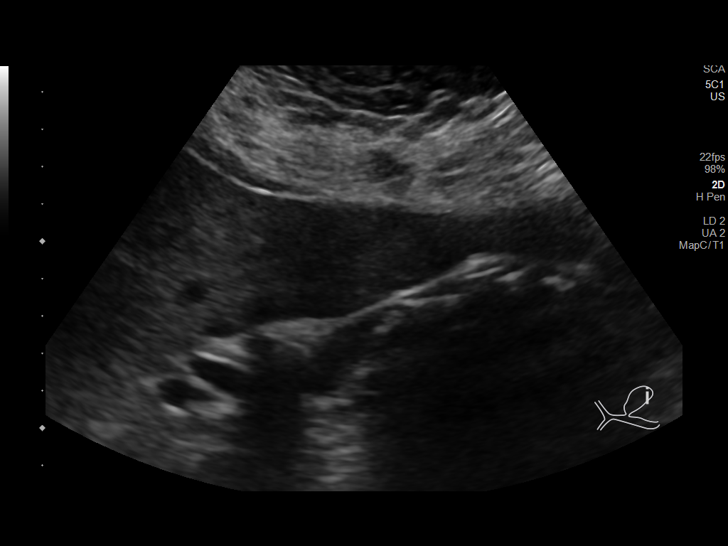
[im 17/37]
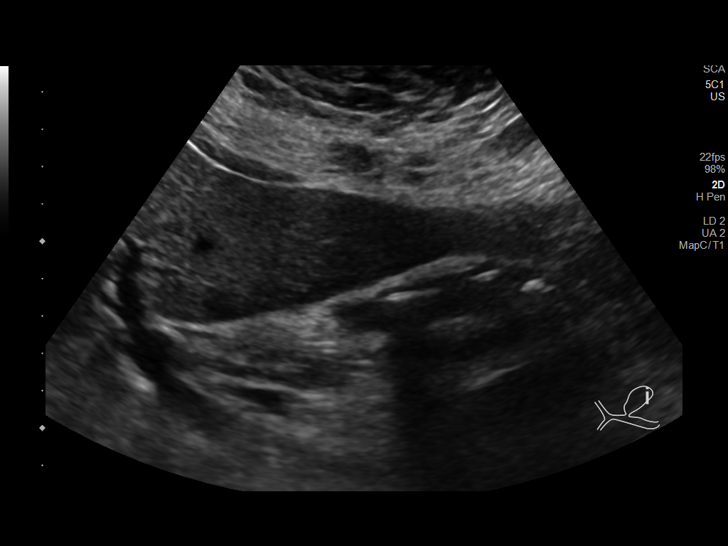
[im 20/37]
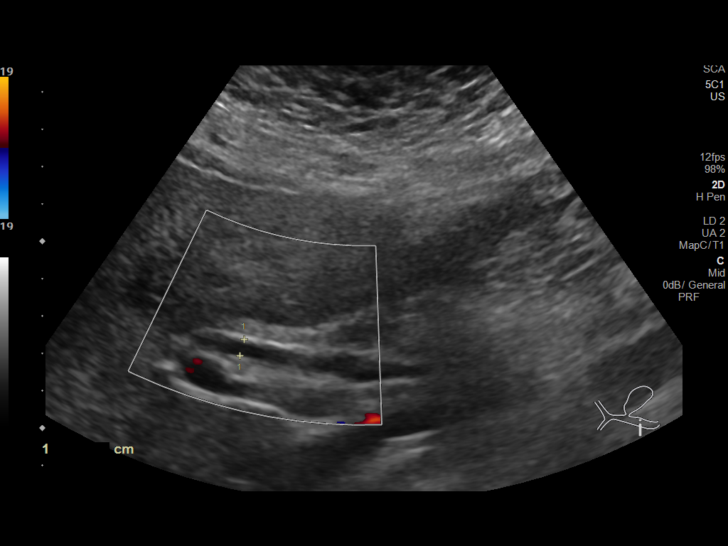
[im 23/37]
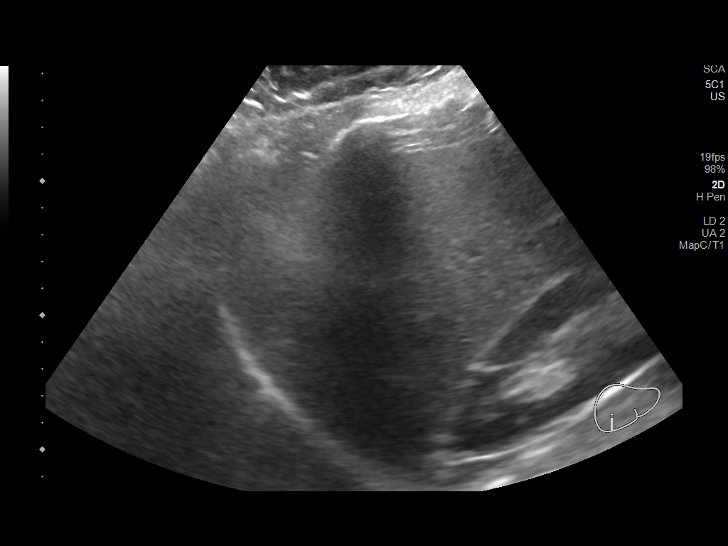
[im 25/37]
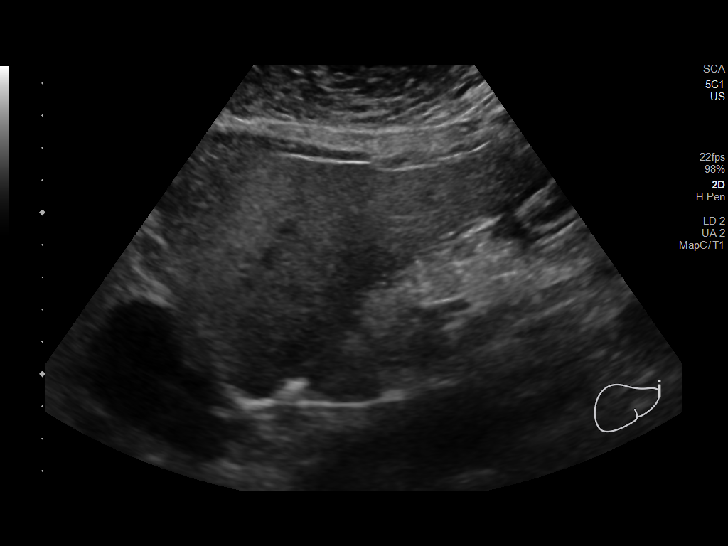
[im 28/37]
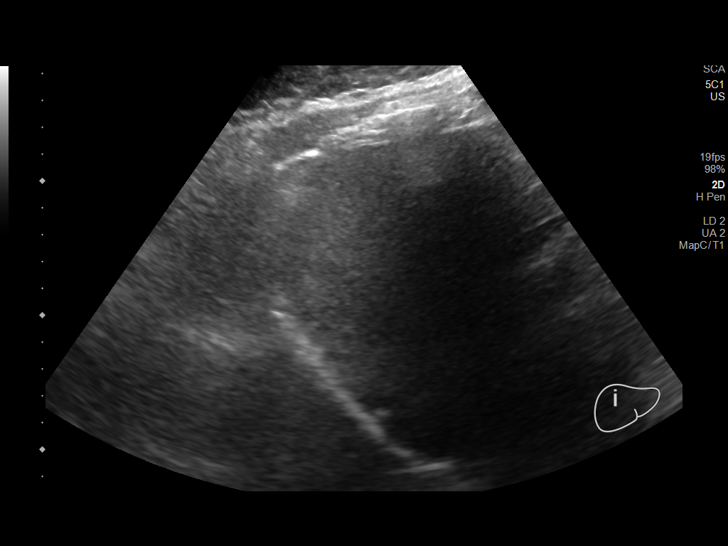
[im 31/37]
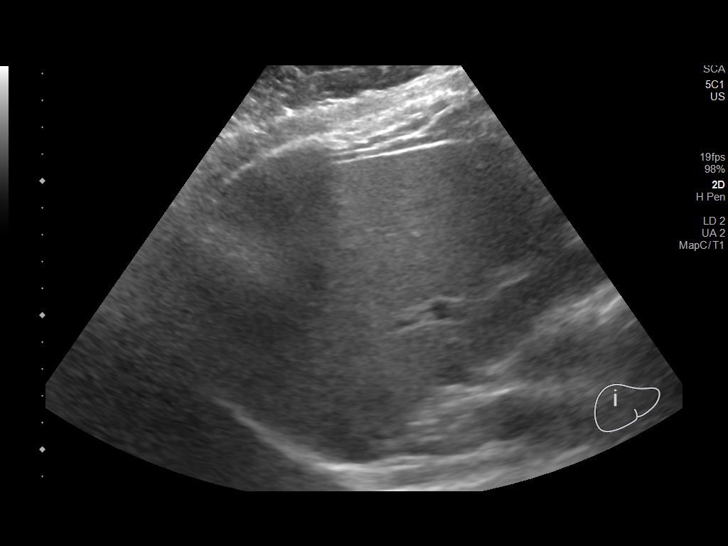
[im 34/37]
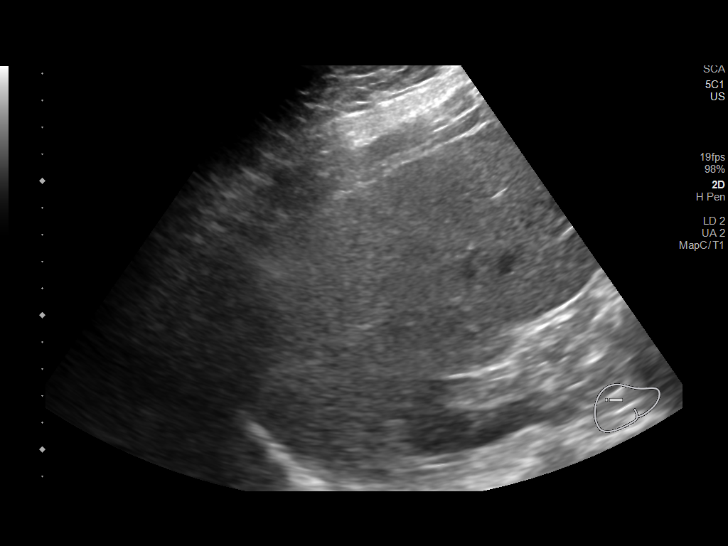
[im 37/37]
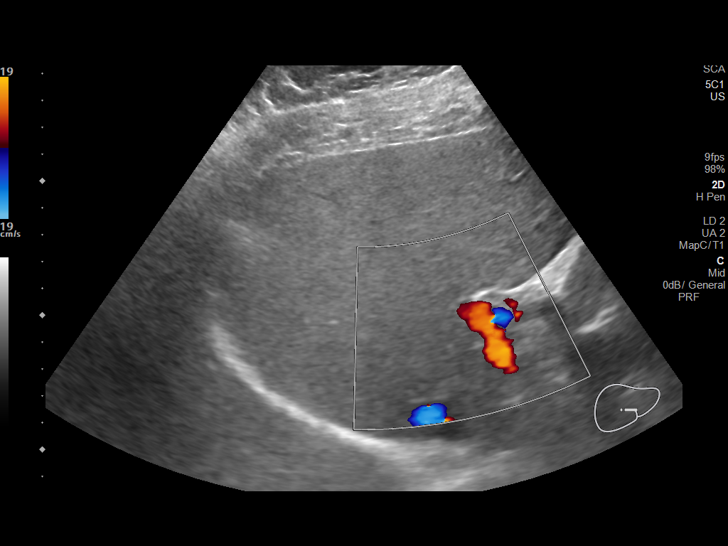

[14 of 25 positions shown; findings below may reference images not displayed]

FINDINGS: Gallbladder:

Cholelithiasis is noted. Gallbladder is decompressed contributing to
wall thickening. No true wall thickening or pericholecystic fluid is
noted.

Common bile duct:

Diameter: 4.5 mm.

Liver:

Diffuse increased echogenicity is noted consistent with fatty
infiltration. Portal vein is patent on color Doppler imaging with
normal direction of blood flow towards the liver.

Other: None.
IMPRESSION: Fatty liver.

Contracted gallbladder with cholelithiasis.

## 2019-09-18 MED ORDER — ONDANSETRON 4 MG PO TBDP: 4.0000 mg | ORAL_TABLET | Freq: Three times a day (TID) | ORAL | 0 refills | Status: DC | PRN

## 2019-09-18 MED ORDER — PANTOPRAZOLE SODIUM 20 MG PO TBEC
20.0000 mg | DELAYED_RELEASE_TABLET | Freq: Every day | ORAL | 1 refills | Status: DC
Start: 2019-09-18 — End: 2019-09-21

## 2019-09-18 MED ORDER — POTASSIUM CHLORIDE CRYS ER 20 MEQ PO TBCR: 20.0000 meq | EXTENDED_RELEASE_TABLET | Freq: Two times a day (BID) | ORAL | 0 refills | Status: DC

## 2019-09-18 MED ORDER — SODIUM CHLORIDE 0.9 % IV BOLUS
1000.0000 mL | Freq: Once | INTRAVENOUS | Status: AC
  Administered 2019-09-18: 1000 mL via INTRAVENOUS

## 2019-09-18 MED ORDER — PROMETHAZINE HCL 25 MG/ML IJ SOLN
INTRAMUSCULAR | Status: AC
  Administered 2019-09-18: 12.5 mg via INTRAVENOUS
  Filled 2019-09-18: qty 1

## 2019-09-18 MED ORDER — FAMOTIDINE 20 MG PO TABS
20.0000 mg | ORAL_TABLET | Freq: Two times a day (BID) | ORAL | 0 refills | Status: DC
Start: 2019-09-18 — End: 2019-09-21

## 2019-09-18 MED ORDER — PROMETHAZINE HCL 25 MG PO TABS
25.0000 mg | ORAL_TABLET | Freq: Four times a day (QID) | ORAL | 0 refills | Status: DC | PRN
Start: 2019-09-18 — End: 2019-09-21

## 2019-09-18 MED ORDER — PROMETHAZINE HCL 25 MG/ML IJ SOLN: 12.5000 mg | Freq: Once | INTRAMUSCULAR | Status: AC

## 2019-09-18 NOTE — ED Triage Notes (Addendum)
Cont n/v/d x 4 days seen here yesterday for same, states zofran not working

## 2019-09-18 NOTE — Progress Notes (Signed)
[facility] [address] [place], [place] [address] [phone]  Date:  09/19/2019   Name:  Hailey Bowman   DOB:  1970-03-19   MRN:  [id number]  PCP:  Wanda Plump, MD    Chief Complaint: No chief complaint on file.   History of Present Illness:  Hailey Bowman is a 49 y.o. very pleasant female patient who presents with the following:  Primary patient of my partner Dr. Drue Novel, history of allergies and asthma I have not seen her myself previously  Virtual visit today for concern of food poisoning/abdominal pain Connected with the patient via telephone, confirmed identity with 2 factors.  She gives consent for virtual visit today, the patient and myself are present on the call  Patient was seen in the ER twice on July 6-Tuesday She became ill on Saturday, ate some food would seem possibly off and shortly thereafter noted nausea, vomiting and then diarrhea She was seen in the ER early late Monday/Tuesday morning, was discharged to home with Zofran and potassium She returned to the ER in the afternoon with continued symptoms- note is still pending but pt was discharged after labs, IVF and RUQ [place]  US Abdomen Limited RUQ  Result Date: 09/18/2019 CLINICAL DATA:  Right upper quadrant pain EXAM: ULTRASOUND ABDOMEN LIMITED RIGHT UPPER QUADRANT COMPARISON:  None. FINDINGS: Gallbladder: Cholelithiasis is noted. Gallbladder is decompressed contributing to wall thickening. No true wall thickening or pericholecystic fluid is noted. Common bile duct: Diameter: 4.5 mm. Liver: Diffuse increased echogenicity is noted consistent with fatty infiltration. Portal vein is patent on color Doppler imaging with normal direction of blood flow towards the liver. Other: None. IMPRESSION: Fatty liver. Contracted gallbladder with cholelithiasis. Electronically Signed   By: Alcide Clever M.D.   On: 09/18/2019 19:10   She did have a BM this am She  feels tired out but no fever Last vomited after she took pepto about 24 hours ago She did eat some oatmeal this am and did ok  She had her covid vaccines    Patient Active Problem List   Diagnosis Date Noted  . Allergic rhinitis 08/23/2013  . Otitis media 03/09/2012  . Tobacco abuse 03/09/2012  . Asthma   . ANXIETY 11/30/2006    Past Medical History:  Diagnosis Date  . Anxiety   . Asthma   . Contraceptive use    condoms  . Depression   . History of UTI    as a child     Past Surgical History:  Procedure Laterality Date  . DILATION AND CURETTAGE OF UTERUS      Social History   Tobacco Use  . Smoking status: Current Every Day Smoker    Types: Cigarettes  . Smokeless tobacco: Never Used  . Tobacco comment: 1 ppd  Vaping Use  . Vaping Use: Never used  Substance Use Topics  . Alcohol use: Yes    Comment: occ  . Drug use: Yes    Types: Marijuana    Family History  Problem Relation Age of Onset  . Breast cancer Mother   . Diabetes Other        GF?  . CAD Other        GM CHF,CAD  . Colon cancer Neg Hx     Allergies  Allergen Reactions  . Ibuprofen Hives  . Penicillins     Medication list has been reviewed and updated.  Current  Outpatient Medications on File Prior to Visit  Medication Sig Dispense Refill  . albuterol (PROAIR HFA) 108 (90 BASE) MCG/ACT inhaler Inhale 2 puffs into the lungs every 6 (six) hours as needed for wheezing or shortness of breath. 1 Inhaler 0  . buPROPion (WELLBUTRIN) 100 MG tablet Take 1 tablet (100 mg total) by mouth 2 (two) times daily. 60 tablet 3  . ondansetron (ZOFRAN ODT) 4 MG disintegrating tablet Take 1 tablet (4 mg total) by mouth every 8 (eight) hours as needed for nausea or vomiting. 20 tablet 0  . potassium chloride SA (KLOR-CON) 20 MEQ tablet Take 1 tablet (20 mEq total) by mouth 2 (two) times daily for 3 days. 6 tablet 0   No current facility-administered medications on file prior to visit.    Review of  Systems:  As per HPI- otherwise negative.   Physical Examination: There were no vitals filed for this visit. There were no vitals filed for this visit. There is no height or weight on file to calculate BMI. Ideal Body Weight:    Patient does not have her MyChart set up yet, we are not able to get video functionality  She sounds well, no wheezing, cough, distress is noted   Assessment and Plan: Gallstones - Plan: Ambulatory referral to General Surgery  Hospital discharge follow-up  Virtual visit today to follow-up from recent ER visit x2.  Patient was thought to have possibly food poisoning, but was also found to have gallstones.  Per patient, her symptoms are now thought to be due to gallbladder colic.  She has a referral to GI but no appointment until August.  We discussed her situation, I feel it is actually more urgent for her to see general surgery.  I placed an urgent referral for her and asked her to call ASAP.  In the meantime, she will keep on a light and low-fat diet to reduce risk of gallbladder colic.  I advised her that she may need an elective cholecystectomy.  However, if she is not doing okay in the meantime-if pain is persistent or worse, vomiting or fever-she should return to the ER for immediate evaluation.  She states understanding and agreement  Telephone used for the entire visit- spoke to pt for just over 10 minutes total     Signed Abbe Amsterdam, MD

## 2019-09-18 NOTE — Discharge Instructions (Signed)
There appears to be stones in the gallbladder.  This could be causing what is known as symptomatic cholelithiasis, which simply means the gallbladder stones are causing symptoms. Your symptoms could also be due to irritation in the stomach.  Diet: Start with a clear liquid diet, progressed to a full liquid diet, and then bland solids as you are able. Please adhere to the enclosed dietary suggestions.  In general, avoid NSAIDs (i.e. ibuprofen, naproxen, etc.), caffeine, alcohol, spicy foods, fatty foods, or any other foods that seem to cause your symptoms to arise.  Protonix: Take this medication daily, 20-30 minutes prior to your first meal, for the next 8 weeks.  Continue to take this medication even if you begin to feel better.  Pepcid: Take this medication twice a day for the next 5 days.  Nausea/vomiting: Use the promethazine (generic for Phenergan) for nausea or vomiting.  This medication may not prevent all vomiting or nausea, but can help facilitate better hydration. Things that can help with nausea/vomiting also include peppermint/menthol candies, vitamin B12, and ginger.  Follow-up: Please follow-up with the gastroenterologist.  Call to make appointment.  May also follow-up with the general surgery office.  Return: Return to the ED for significantly worsening symptoms, persistent vomiting, persistent fever, abdominal pain, vomiting blood, blood in the stools, dark stools, or any other major concerns.  For prescription assistance, may try using prescription discount sites or apps, such as goodrx.com

## 2019-09-18 NOTE — Discharge Instructions (Addendum)
You were evaluated in the Emergency Department and after careful evaluation, we did not find any emergent condition requiring admission or further testing in the hospital.  Your exam/testing today was overall reassuring.  Your symptoms seem to be due to food poisoning.  Please use the Zofran medication as needed for nausea.  Please take the potassium supplementation as directed over the next 3 days.  Drink plenty of fluids at home.  Please return to the Emergency Department if you experience any worsening of your condition.  We encourage you to follow up with a primary care provider.  Thank you for allowing [place] to be a part of your care.

## 2019-09-18 NOTE — ED Notes (Signed)
PT Has Gatorade at bedside. PT attempting to take fluids now.

## 2019-09-18 NOTE — ED Provider Notes (Signed)
[facility]PT [facility] Emergency Department Provider Note MRN:  [id number]  Arrival date & time: 09/18/19     Chief Complaint   Abdominal Pain   History of Present Illness   Hailey Bowman is a 49 y.o. year-old female with a history of asthma presenting to the ED with chief complaint of abdominal pain.  Patient had a leftover meal Saturday for lunch that "tasted funny".  Shortly after began experiencing nausea, vomiting, later in the evening began experiencing diarrhea.  Had a brief time yesterday where she felt better but then the symptoms recurred.  Currently feels okay.  Denies fever, no recent travel, no recent antibiotics, no blood in the vomit or the stool.  No chest pain or shortness of breath, no headache.  Review of Systems  A complete 10 system review of systems was obtained and all systems are negative except as noted in the HPI and PMH.   Patient's Health History    Past Medical History:  Diagnosis Date  . Anxiety   . Asthma   . Contraceptive use    condoms  . Depression   . History of UTI    as a child     Past Surgical History:  Procedure Laterality Date  . DILATION AND CURETTAGE OF UTERUS      Family History  Problem Relation Age of Onset  . Breast cancer Mother   . Diabetes Other        GF?  . CAD Other        GM CHF,CAD  . Colon cancer Neg Hx     Social History   Socioeconomic History  . Marital status: Married    Spouse name: Not on file  . Number of children: 1  . Years of education: Not on file  . Highest education level: Not on file  Occupational History  . Occupation: not working at present  Tobacco Use  . Smoking status: Current Every Day Smoker    Types: Cigarettes  . Smokeless tobacco: Never Used  . Tobacco comment: 1 ppd  Vaping Use  . Vaping Use: Never used  Substance and Sexual Activity  . Alcohol use: Yes    Comment: occ  . Drug use: Yes    Types: Marijuana  . Sexual activity: Not on file  Other  Topics Concern  . Not on file  Social History Narrative   G2P1   Social Determinants of Health   Financial Resource Strain:   . Difficulty of Paying Living Expenses:   Food Insecurity:   . Worried About Programme researcher, broadcasting/film/video in the Last Year:   . Barista in the Last Year:   Transportation Needs:   . Freight forwarder (Medical):   Marland Kitchen Lack of Transportation (Non-Medical):   Physical Activity:   . Days of Exercise per Week:   . Minutes of Exercise per Session:   Stress:   . Feeling of Stress :   Social Connections:   . Frequency of Communication with Friends and Family:   . Frequency of Social Gatherings with Friends and Family:   . Attends Religious Services:   . Active Member of Clubs or Organizations:   . Attends Banker Meetings:   Marland Kitchen Marital Status:   Intimate Partner Violence:   . Fear of Current or Ex-Partner:   . Emotionally Abused:   Marland Kitchen Physically Abused:   . Sexually Abused:      Physical Exam   Vitals:  09/17/19 2308  BP: (!) 148/98  Pulse: 98  Resp: 18  Temp: 98.6 F (37 C)  SpO2: 98%    CONSTITUTIONAL: Well-appearing, NAD NEURO:  Alert and oriented x 3, no focal deficits EYES:  eyes equal and reactive ENT/NECK:  no LAD, no JVD CARDIO: Regular rate, well-perfused, normal S1 and S2 PULM:  CTAB no wheezing or rhonchi GI/GU:  normal bowel sounds, non-distended, non-tender MSK/SPINE:  No gross deformities, no edema SKIN:  no rash, atraumatic PSYCH:  Appropriate speech and behavior  *Additional and/or pertinent findings included in MDM below  Diagnostic and Interventional Summary    EKG Interpretation  Date/Time:  Monday September 17 2019 23:04:27 EDT Ventricular Rate:  88 PR Interval:  136 QRS Duration: 78 QT Interval:  394 QTC Calculation: 476 R Axis:   53 Text Interpretation: Normal sinus rhythm Normal ECG Confirmed by Kennis Carina [phone]) on 09/18/2019 1:54:55 AM      Labs Reviewed  COMPREHENSIVE METABOLIC PANEL -  Abnormal; Notable for the following components:      Result Value   Potassium 3.1 (*)    CO2 21 (*)    Glucose, Bld 139 (*)    Calcium 8.6 (*)    Total Bilirubin 1.4 (*)    All other components within normal limits  CBC - Abnormal; Notable for the following components:   WBC 18.9 (*)    RBC 5.23 (*)    Hemoglobin 15.6 (*)    HCT 46.6 (*)    All other components within normal limits  URINALYSIS, ROUTINE W REFLEX MICROSCOPIC - Abnormal; Notable for the following components:   APPearance HAZY (*)    Hgb urine dipstick MODERATE (*)    Bilirubin Urine SMALL (*)    Ketones, ur 40 (*)    All other components within normal limits  URINALYSIS, MICROSCOPIC (REFLEX) - Abnormal; Notable for the following components:   Bacteria, UA FEW (*)    All other components within normal limits  LIPASE, BLOOD  PREGNANCY, URINE    No orders to display    Medications  sodium chloride flush (NS) 0.9 % injection 3 mL (3 mLs Intravenous Not Given 09/18/19 0203)     Procedures  /  Critical Care Procedures  ED Course and Medical Decision Making  I have reviewed the triage vital signs, the nursing notes, and pertinent available records from the EMR.  Listed above are laboratory and imaging tests that I personally ordered, reviewed, and interpreted and then considered in my medical decision making (see below for details).      Suspect borne illness, reassuring vital signs, completely benign abdomen, no indication for imaging, labs reassuring, seems a bit dry, advised to push fluids, will provide a few days of potassium supplementation.    Elmer Sow. Pilar Plate, MD [facility] Emergency Medicine [facility] [email or web address]  Final Clinical Impressions(s) / ED Diagnoses     ICD-10-CM   1. Nausea vomiting and diarrhea  R11.2    R19.7     ED Discharge Orders         Ordered    ondansetron (ZOFRAN ODT) 4 MG disintegrating tablet  Every 8 hours PRN     Discontinue  Reprint      09/18/19 0204    potassium chloride SA (KLOR-CON) 20 MEQ tablet  2 times daily     Discontinue  Reprint     09/18/19 0204           Discharge Instructions Discussed with and Provided  to Patient:     Discharge Instructions     You were evaluated in the Emergency Department and after careful evaluation, we did not find any emergent condition requiring admission or further testing in the hospital.  Your exam/testing today was overall reassuring.  Your symptoms seem to be due to food poisoning.  Please use the Zofran medication as needed for nausea.  Please take the potassium supplementation as directed over the next 3 days.  Drink plenty of fluids at home.  Please return to the Emergency Department if you experience any worsening of your condition.  We encourage you to follow up with a primary care provider.  Thank you for allowing [place] to be a part of your care.       Sabas Sous, MD 09/18/19 [phone]

## 2019-09-18 NOTE — ED Provider Notes (Signed)
[facility] EMERGENCY DEPARTMENT Provider Note   CSN: [id number] Arrival date & time: 09/18/19  1540     History Chief Complaint  Patient presents with  . Emesis    Hailey Bowman is a 49 y.o. female.  HPI      Hailey Bowman is a 49 y.o. female, with a history of anxiety, asthma, presenting to the ED with continued nausea.  She has been experiencing several days of intermittent nausea.  She states she thought she could possibly have food poisoning because 4 hours after eating food leftovers, she began to have intense nausea.  She did have a couple episodes of vomiting about 2 days ago, but none since. She states the episodes of nausea will last for a few hours and then subside.  They seem to recur after having a bowel movement.  She was seen last night in the ED for the same complaint.  She states that Zofran does not seem to be effective. Although she denies abdominal pain, she does endorse a fullness sensation to the epigastric region accompanying the nausea. Denies fever/chills, diarrhea, constipation, hematochezia/melena, abdominal pain, chest pain, shortness of breath, syncope, urinary symptoms, or any other complaints.    Past Medical History:  Diagnosis Date  . Anxiety   . Asthma   . Contraceptive use    condoms  . Depression   . History of UTI    as a child     Patient Active Problem List   Diagnosis Date Noted  . Allergic rhinitis 08/23/2013  . Otitis media 03/09/2012  . Tobacco abuse 03/09/2012  . Asthma   . ANXIETY 11/30/2006    Past Surgical History:  Procedure Laterality Date  . DILATION AND CURETTAGE OF UTERUS       OB History   No obstetric history on file.     Family History  Problem Relation Age of Onset  . Breast cancer Mother   . Diabetes Other        GF?  . CAD Other        GM CHF,CAD  . Colon cancer Neg Hx     Social History   Tobacco Use  . Smoking status: Current Every Day Smoker    Types:  Cigarettes  . Smokeless tobacco: Never Used  . Tobacco comment: 1 ppd  Vaping Use  . Vaping Use: Never used  Substance Use Topics  . Alcohol use: Yes    Comment: occ  . Drug use: Yes    Types: Marijuana    Home Medications Prior to Admission medications   Medication Sig Start Date End Date Taking? Authorizing Provider  albuterol (PROAIR HFA) 108 (90 BASE) MCG/ACT inhaler Inhale 2 puffs into the lungs every 6 (six) hours as needed for wheezing or shortness of breath. 01/10/15   Wanda Plump, MD  buPROPion (WELLBUTRIN) 100 MG tablet Take 1 tablet (100 mg total) by mouth 2 (two) times daily. 07/16/14   Wanda Plump, MD  famotidine (PEPCID) 20 MG tablet Take 1 tablet (20 mg total) by mouth 2 (two) times daily for 5 days. 09/18/19 09/23/19  Adan Baehr C, PA-C  ondansetron (ZOFRAN ODT) 4 MG disintegrating tablet Take 1 tablet (4 mg total) by mouth every 8 (eight) hours as needed for nausea or vomiting. 09/18/19   Sabas Sous, MD  pantoprazole (PROTONIX) 20 MG tablet Take 1 tablet (20 mg total) by mouth daily. 09/18/19 11/17/19  Casimer Russett C, PA-C  potassium chloride SA (KLOR-CON)  20 MEQ tablet Take 1 tablet (20 mEq total) by mouth 2 (two) times daily for 3 days. 09/18/19 09/21/19  Sabas Sous, MD  promethazine (PHENERGAN) 25 MG tablet Take 1 tablet (25 mg total) by mouth every 6 (six) hours as needed for nausea or vomiting. 09/18/19   Glennie Bose C, PA-C    Allergies    Ibuprofen and Penicillins  Review of Systems   Review of Systems  Constitutional: Negative for chills, diaphoresis and fever.  Respiratory: Negative for cough and shortness of breath.   Cardiovascular: Negative for chest pain.  Gastrointestinal: Positive for nausea. Negative for abdominal pain, blood in stool, constipation, diarrhea and vomiting.  Genitourinary: Negative for dysuria, flank pain and hematuria.  Musculoskeletal: Negative for back pain.  Neurological: Negative for dizziness, syncope, weakness, numbness and headaches.    All other systems reviewed and are negative.   Physical Exam Updated Vital Signs BP (!) 169/95 (BP Location: Right Arm)   Pulse 77   Temp 98.8 F (37.1 C) (Oral)   Resp 16   SpO2 97%   Physical Exam Vitals and nursing note reviewed.  Constitutional:      General: She is not in acute distress.    Appearance: She is well-developed. She is not diaphoretic.  HENT:     Head: Normocephalic and atraumatic.     Mouth/Throat:     Mouth: Mucous membranes are moist.     Pharynx: Oropharynx is clear.  Eyes:     Conjunctiva/sclera: Conjunctivae normal.  Cardiovascular:     Rate and Rhythm: Normal rate and regular rhythm.     Pulses: Normal pulses.          Radial pulses are 2+ on the right side and 2+ on the left side.       Posterior tibial pulses are 2+ on the right side and 2+ on the left side.     Heart sounds: Normal heart sounds.     Comments: Tactile temperature in the extremities appropriate and equal bilaterally. Pulmonary:     Effort: Pulmonary effort is normal. No respiratory distress.     Breath sounds: Normal breath sounds.  Abdominal:     Palpations: Abdomen is soft.     Tenderness: There is no abdominal tenderness. There is no guarding.  Musculoskeletal:     Cervical back: Neck supple.     Right lower leg: No edema.     Left lower leg: No edema.  Lymphadenopathy:     Cervical: No cervical adenopathy.  Skin:    General: Skin is warm and dry.  Neurological:     Mental Status: She is alert.  Psychiatric:        Mood and Affect: Mood and affect normal.        Speech: Speech normal.        Behavior: Behavior normal.     ED Results / Procedures / Treatments   Labs (all labs ordered are listed, but only abnormal results are displayed) Labs Reviewed  COMPREHENSIVE METABOLIC PANEL - Abnormal; Notable for the following components:      Result Value   Potassium 3.3 (*)    Glucose, Bld 121 (*)    Total Bilirubin 1.6 (*)    All other components within normal  limits  CBC WITH DIFFERENTIAL/PLATELET - Abnormal; Notable for the following components:   WBC 15.5 (*)    Hemoglobin 15.1 (*)    Neutro Abs 10.8 (*)    Monocytes Absolute 1.3 (*)  All other components within normal limits  LIPASE, BLOOD    EKG None  Radiology US Abdomen Limited RUQ  Result Date: 09/18/2019 CLINICAL DATA:  Right upper quadrant pain EXAM: ULTRASOUND ABDOMEN LIMITED RIGHT UPPER QUADRANT COMPARISON:  None. FINDINGS: Gallbladder: Cholelithiasis is noted. Gallbladder is decompressed contributing to wall thickening. No true wall thickening or pericholecystic fluid is noted. Common bile duct: Diameter: 4.5 mm. Liver: Diffuse increased echogenicity is noted consistent with fatty infiltration. Portal vein is patent on color Doppler imaging with normal direction of blood flow towards the liver. Other: None. IMPRESSION: Fatty liver. Contracted gallbladder with cholelithiasis. Electronically Signed   By: Alcide Clever M.D.   On: 09/18/2019 19:10    Procedures Procedures (including critical care time)  Medications Ordered in ED Medications  sodium chloride 0.9 % bolus 1,000 mL (0 mLs Intravenous Stopped 09/18/19 2017)  promethazine (PHENERGAN) injection 12.5 mg (12.5 mg Intravenous Given 09/18/19 1829)    ED Course  I have reviewed the triage vital signs and the nursing notes.  Pertinent labs & imaging results that were available during my care of the patient were reviewed by me and considered in my medical decision making (see chart for details).    MDM Rules/Calculators/A&P                          Patient presents with episodic nausea. Patient is nontoxic appearing, afebrile, not tachycardic, not tachypneic, not hypotensive, maintains excellent SPO2 on room air, and is in no apparent distress.   I have reviewed the patient's chart to obtain more information.   I reviewed and interpreted the patient's labs and radiological studies. Patient did not have any vomiting or even  instances of nausea during her time in the ED.  She continued to be pain-free throughout her ED course. Her leukocytosis that was noted last night has improved today. She has also had improvement in her hypokalemia. RUQ ultrasound shows cholelithiasis.  Patient symptoms could possibly be due to symptomatic cholelithiasis.  GI versus general surgery follow-up.  The patient was given instructions for home care as well as return precautions. Patient voices understanding of these instructions, accepts the plan, and is comfortable with discharge.   Findings and plan of care discussed with Vanetta Mulders, MD.   Vitals:   09/18/19 1549 09/18/19 1754 09/18/19 2011  BP: (!) 169/95 (!) 149/94 (!) 157/100  Pulse: 77 89 67  Resp: 16 18 18   Temp: 98.8 F (37.1 C)    TempSrc: Oral    SpO2: 97% 100% 100%    Final Clinical Impression(s) / ED Diagnoses Final diagnoses:  Epigastric pain  Nausea    Rx / DC Orders ED Discharge Orders         Ordered    Ambulatory referral to Gastroenterology     Discontinue  Reprint     09/18/19 1955    promethazine (PHENERGAN) 25 MG tablet  Every 6 hours PRN     Discontinue  Reprint     09/18/19 1958    pantoprazole (PROTONIX) 20 MG tablet  Daily     Discontinue  Reprint     09/18/19 1958    famotidine (PEPCID) 20 MG tablet  2 times daily     Discontinue  Reprint     09/18/19 1958           11/19/19, PA-C 09/20/19 0540    11/21/19, MD 09/22/19 1514

## 2019-09-19 ENCOUNTER — Encounter (HOSPITAL_COMMUNITY): Payer: Self-pay

## 2019-09-19 ENCOUNTER — Telehealth (INDEPENDENT_AMBULATORY_CARE_PROVIDER_SITE_OTHER): Payer: BC Managed Care – PPO | Admitting: Family Medicine

## 2019-09-19 ENCOUNTER — Observation Stay (HOSPITAL_COMMUNITY)
Admission: EM | Admit: 2019-09-19 | Discharge: 2019-09-21 | Disposition: A | Discharge status: Home / Self Care | Payer: BC Managed Care – PPO | Attending: General Surgery | Admitting: General Surgery

## 2019-09-19 ENCOUNTER — Telehealth: Payer: BC Managed Care – PPO | Admitting: Family Medicine

## 2019-09-19 ENCOUNTER — Encounter: Payer: Self-pay | Admitting: Family Medicine

## 2019-09-19 ENCOUNTER — Encounter: Payer: Self-pay | Admitting: Physician Assistant

## 2019-09-19 ENCOUNTER — Other Ambulatory Visit: Payer: Self-pay

## 2019-09-19 DIAGNOSIS — Z09 Encounter for follow-up examination after completed treatment for conditions other than malignant neoplasm: Secondary | ICD-10-CM | POA: Diagnosis not present

## 2019-09-19 DIAGNOSIS — R52 Pain, unspecified: Secondary | ICD-10-CM

## 2019-09-19 DIAGNOSIS — K819 Cholecystitis, unspecified: Secondary | ICD-10-CM | POA: Diagnosis not present

## 2019-09-19 DIAGNOSIS — Z03818 Encounter for observation for suspected exposure to other biological agents ruled out: Secondary | ICD-10-CM | POA: Diagnosis not present

## 2019-09-19 DIAGNOSIS — K915 Postcholecystectomy syndrome: Secondary | ICD-10-CM | POA: Diagnosis not present

## 2019-09-19 DIAGNOSIS — R1011 Right upper quadrant pain: Secondary | ICD-10-CM | POA: Diagnosis not present

## 2019-09-19 DIAGNOSIS — Z79899 Other long term (current) drug therapy: Secondary | ICD-10-CM | POA: Insufficient documentation

## 2019-09-19 DIAGNOSIS — F17210 Nicotine dependence, cigarettes, uncomplicated: Secondary | ICD-10-CM | POA: Insufficient documentation

## 2019-09-19 DIAGNOSIS — K810 Acute cholecystitis: Secondary | ICD-10-CM | POA: Diagnosis present

## 2019-09-19 DIAGNOSIS — Z20822 Contact with and (suspected) exposure to covid-19: Secondary | ICD-10-CM | POA: Insufficient documentation

## 2019-09-19 DIAGNOSIS — Z419 Encounter for procedure for purposes other than remedying health state, unspecified: Secondary | ICD-10-CM

## 2019-09-19 DIAGNOSIS — K8020 Calculus of gallbladder without cholecystitis without obstruction: Secondary | ICD-10-CM

## 2019-09-19 LAB — URINALYSIS, ROUTINE W REFLEX MICROSCOPIC

## 2019-09-19 LAB — CBC
HCT: 44.4 % (ref 36.0–46.0)
Hemoglobin: 15.2 g/dL — ABNORMAL HIGH (ref 12.0–15.0)
MCH: 29.8 pg (ref 26.0–34.0)
MCHC: 34.2 g/dL (ref 30.0–36.0)
MCV: 87.1 fL (ref 80.0–100.0)
Platelets: 332 10*3/uL (ref 150–400)
RBC: 5.1 MIL/uL (ref 3.87–5.11)
RDW: 13.8 % (ref 11.5–15.5)
WBC: 16 10*3/uL — ABNORMAL HIGH (ref 4.0–10.5)
nRBC: 0 % (ref 0.0–0.2)

## 2019-09-19 LAB — URINALYSIS, MICROSCOPIC (REFLEX)
RBC / HPF: >50 RBC/hpf (ref 0–5)
WBC, UA: >50 WBC/hpf (ref 0–5)

## 2019-09-19 LAB — COMPREHENSIVE METABOLIC PANEL
ALT: 23 U/L (ref 0–44)
AST: 26 U/L (ref 15–41)
Albumin: 4 g/dL (ref 3.5–5.0)
Alkaline Phosphatase: 61 U/L (ref 38–126)
Anion gap: 15 (ref 5–15)
BUN: 8 mg/dL (ref 6–20)
CO2: 18 mmol/L — ABNORMAL LOW (ref 22–32)
Calcium: 8.8 mg/dL — ABNORMAL LOW (ref 8.9–10.3)
Chloride: 106 mmol/L (ref 98–111)
Creatinine, Ser: 0.82 mg/dL (ref 0.44–1.00)
GFR calc Af Amer: >60 mL/min (ref >60)
GFR calc non Af Amer: >60 mL/min (ref >60)
Glucose, Bld: 122 mg/dL — ABNORMAL HIGH (ref 70–99)
Potassium: 3.4 mmol/L — ABNORMAL LOW (ref 3.5–5.1)
Sodium: 139 mmol/L (ref 135–145)
Total Bilirubin: 1.5 mg/dL — ABNORMAL HIGH (ref 0.3–1.2)
Total Protein: 7.8 g/dL (ref 6.5–8.1)

## 2019-09-19 LAB — LIPASE, BLOOD: Lipase: 24 U/L (ref 11–51)

## 2019-09-19 LAB — I-STAT BETA HCG BLOOD, ED (MC, WL, AP ONLY): I-stat hCG, quantitative: <5 m[IU]/mL (ref <5)

## 2019-09-19 MED ORDER — FENTANYL CITRATE (PF) 100 MCG/2ML IJ SOLN
50.0000 ug | INTRAMUSCULAR | Status: DC | PRN
  Administered 2019-09-19: 50 ug via INTRAVENOUS
  Filled 2019-09-19: qty 2

## 2019-09-19 NOTE — ED Triage Notes (Signed)
Pt arrives POV for eval of RUQ abd pain. Pt has been seen for RUQ abd pain twice in the last 2 days, and was determined to have gallstones. Pt appears uncomfortable in triage.

## 2019-09-20 ENCOUNTER — Other Ambulatory Visit (HOSPITAL_COMMUNITY): Payer: BC Managed Care – PPO

## 2019-09-20 ENCOUNTER — Emergency Department (HOSPITAL_COMMUNITY): Payer: BC Managed Care – PPO | Admitting: Anesthesiology

## 2019-09-20 ENCOUNTER — Encounter (HOSPITAL_COMMUNITY): Payer: Self-pay | Admitting: Anesthesiology

## 2019-09-20 ENCOUNTER — Emergency Department (HOSPITAL_COMMUNITY): Payer: BC Managed Care – PPO

## 2019-09-20 ENCOUNTER — Encounter (HOSPITAL_COMMUNITY)
Admission: EM | Disposition: A | Discharge status: Home / Self Care | Payer: Self-pay | Source: Home / Self Care | Attending: Emergency Medicine

## 2019-09-20 DIAGNOSIS — K8020 Calculus of gallbladder without cholecystitis without obstruction: Secondary | ICD-10-CM | POA: Diagnosis not present

## 2019-09-20 DIAGNOSIS — J309 Allergic rhinitis, unspecified: Secondary | ICD-10-CM | POA: Diagnosis not present

## 2019-09-20 DIAGNOSIS — K810 Acute cholecystitis: Secondary | ICD-10-CM | POA: Diagnosis present

## 2019-09-20 DIAGNOSIS — K819 Cholecystitis, unspecified: Secondary | ICD-10-CM | POA: Diagnosis not present

## 2019-09-20 DIAGNOSIS — K8010 Calculus of gallbladder with chronic cholecystitis without obstruction: Secondary | ICD-10-CM | POA: Diagnosis not present

## 2019-09-20 DIAGNOSIS — F419 Anxiety disorder, unspecified: Secondary | ICD-10-CM | POA: Diagnosis not present

## 2019-09-20 DIAGNOSIS — K915 Postcholecystectomy syndrome: Secondary | ICD-10-CM | POA: Diagnosis not present

## 2019-09-20 DIAGNOSIS — J45909 Unspecified asthma, uncomplicated: Secondary | ICD-10-CM | POA: Diagnosis not present

## 2019-09-20 HISTORY — PX: CHOLECYSTECTOMY: SHX55

## 2019-09-20 LAB — URINALYSIS, ROUTINE W REFLEX MICROSCOPIC
Bacteria, UA: NONE SEEN
Bilirubin Urine: NEGATIVE
Glucose, UA: NEGATIVE mg/dL
Ketones, ur: NEGATIVE mg/dL
Leukocytes,Ua: NEGATIVE
Nitrite: NEGATIVE
Protein, ur: NEGATIVE mg/dL
Specific Gravity, Urine: 1.006 (ref 1.005–1.030)
pH: 6 (ref 5.0–8.0)

## 2019-09-20 LAB — SARS CORONAVIRUS 2 BY RT PCR (HOSPITAL ORDER, PERFORMED IN ~~LOC~~ HOSPITAL LAB): SARS Coronavirus 2: NEGATIVE

## 2019-09-20 IMAGING — US US ABDOMEN LIMITED
1 series · 14 of 25 positions shown · non-contrast
Comparison: Abdominal ultrasound [DATE]

CLINICAL DATA: Abdominal pain since [REDACTED].

EXAM:
ULTRASOUND ABDOMEN LIMITED RIGHT UPPER QUADRANT

[Series 1: us abdomen limited ruq · 14 of 48 slices shown]
[im 1/48]
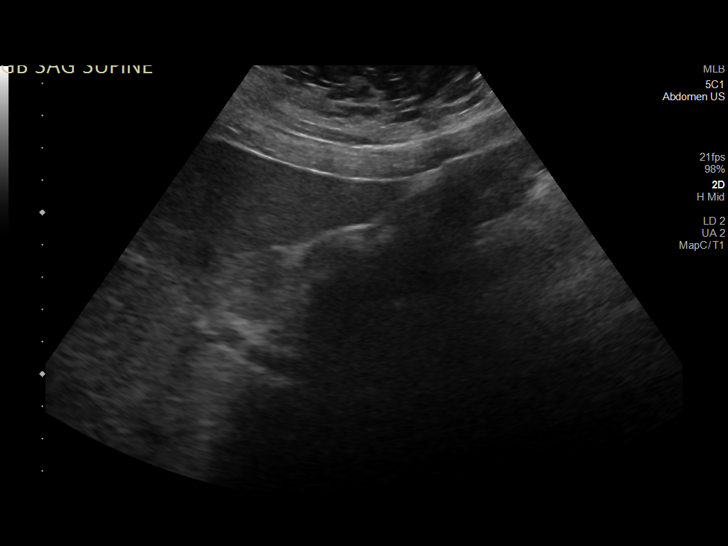
[im 4/48]
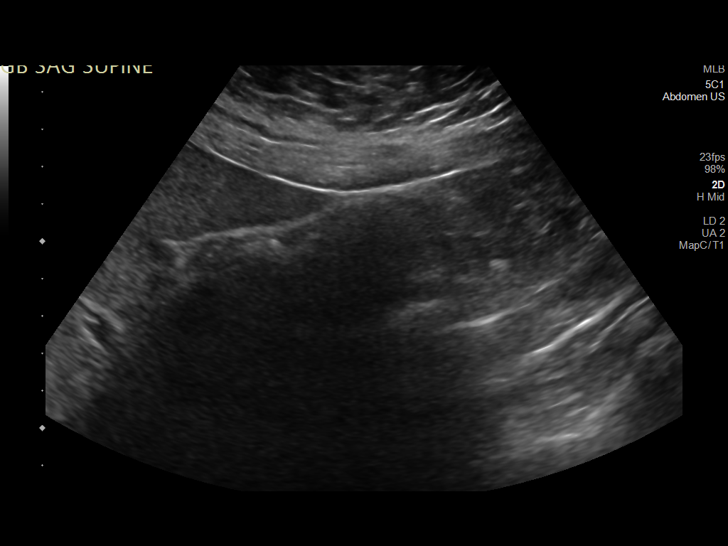
[im 8/48]
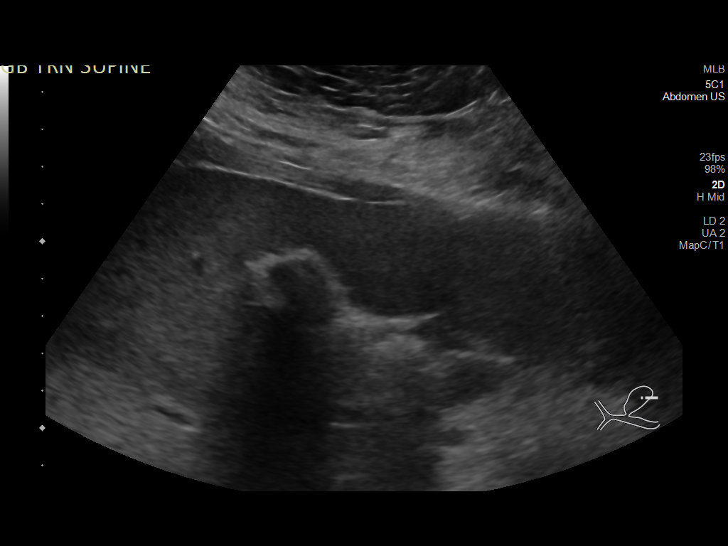
[im 12/48]
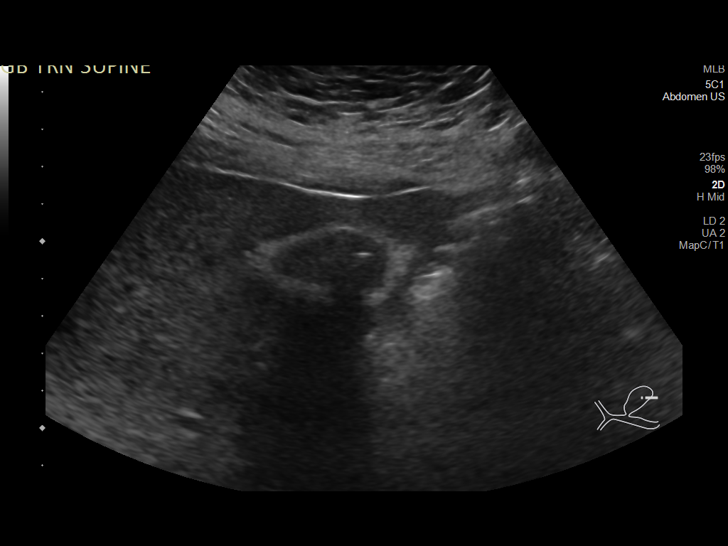
[im 16/48]
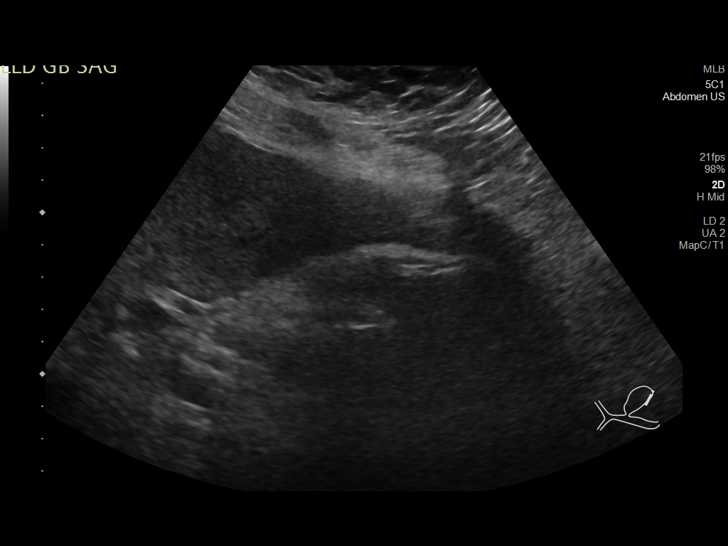
[im 18/48]
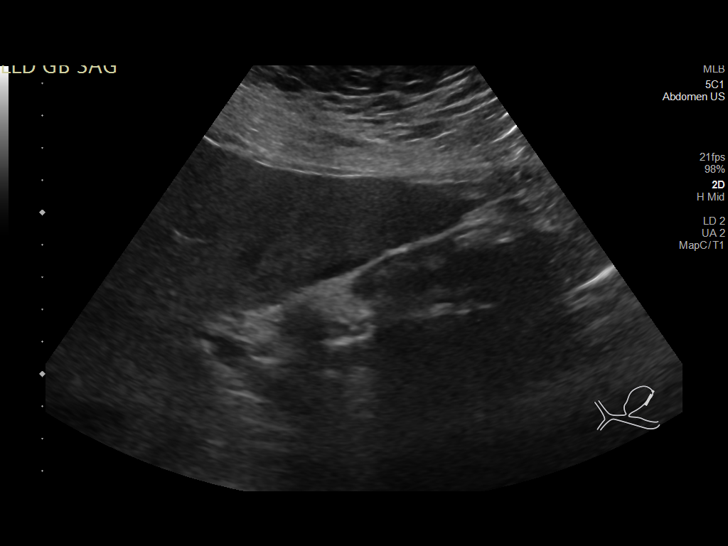
[im 22/48]
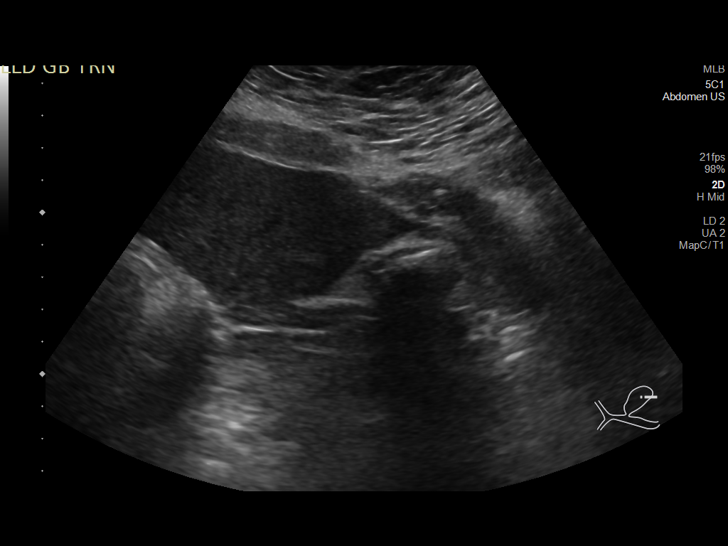
[im 26/48]
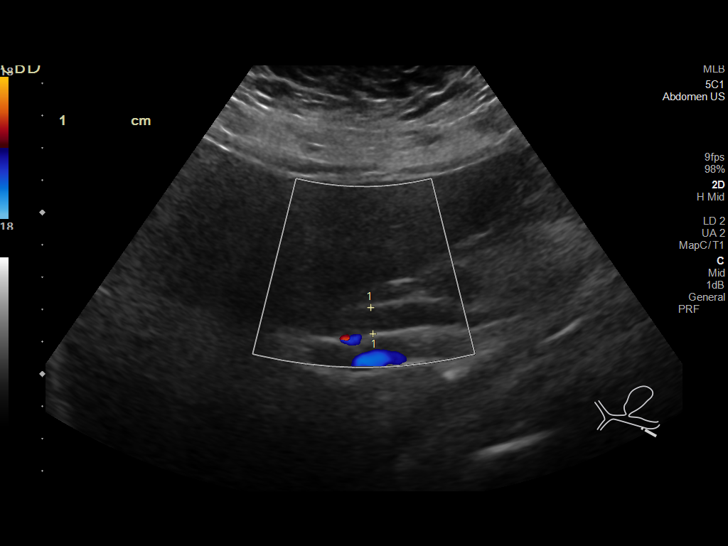
[im 30/48]
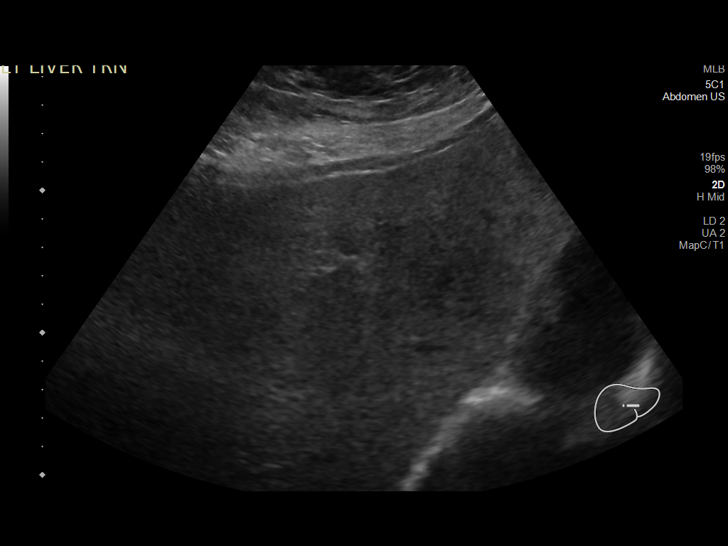
[im 32/48]
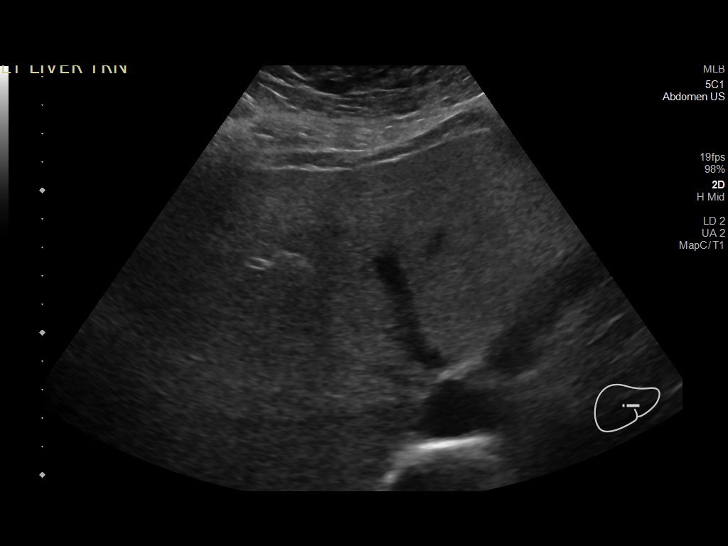
[im 36/48]
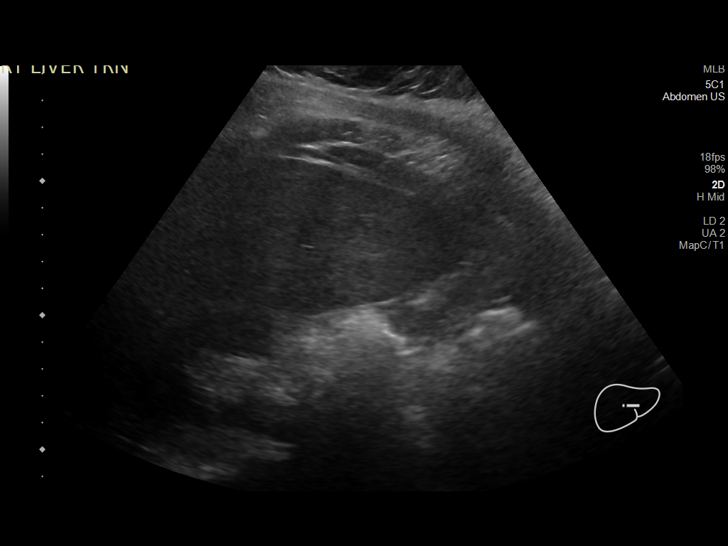
[im 40/48]
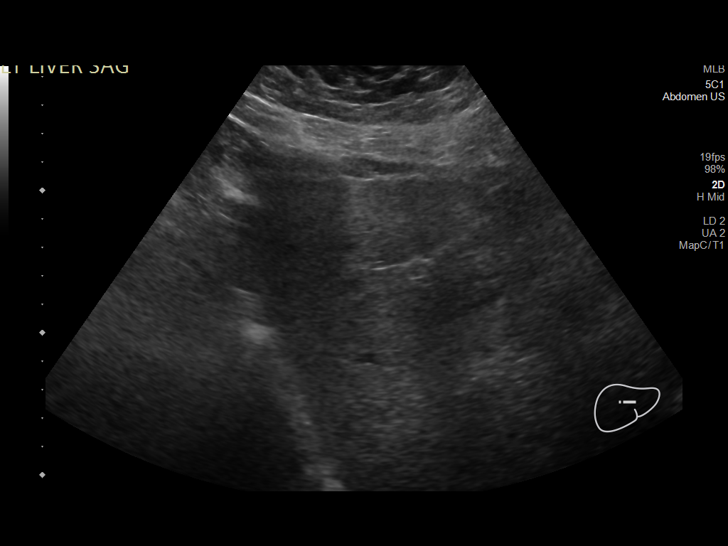
[im 44/48]
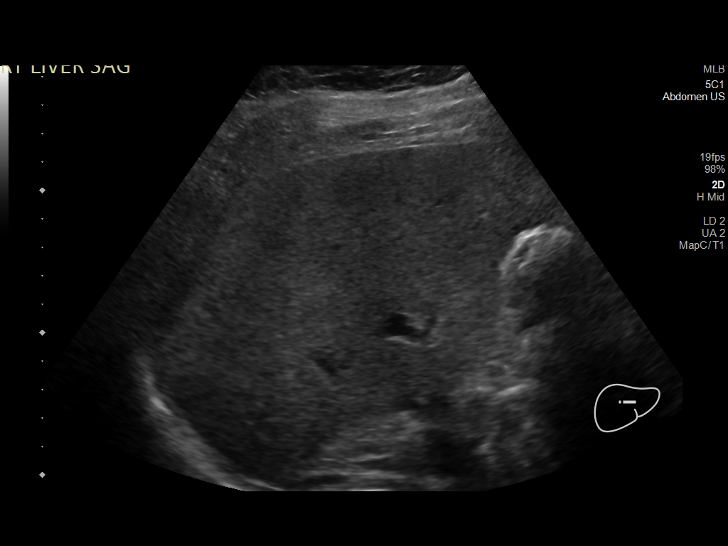
[im 48/48]
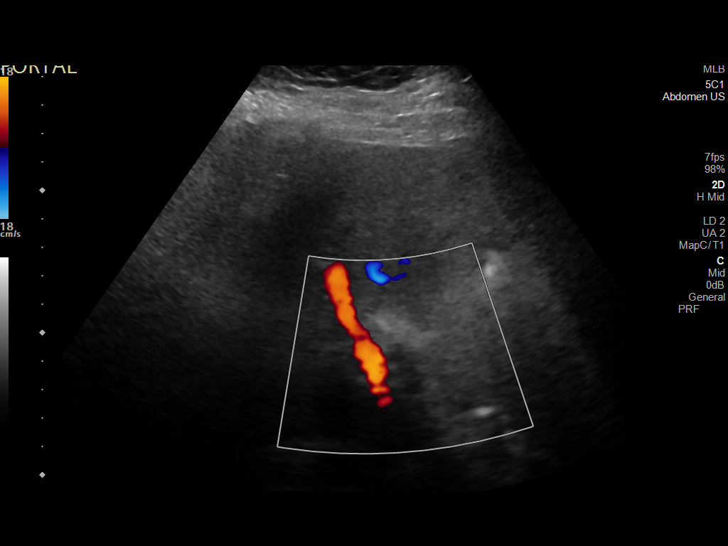

[14 of 25 positions shown; findings below may reference images not displayed]

FINDINGS: Gallbladder:

There are stones measuring up to 1.3 cm. No gallbladder wall
thickening. No sonographic Murphy sign noted by sonographer.

Common bile duct:

Diameter: 0.8 cm.  No intraductal calculus visualized.

Liver:

No focal lesion identified. Mild diffuse increase in liver
parenchymal echogenicity. Portal vein is patent on color Doppler
imaging with normal direction of blood flow towards the liver.

Other: None.
IMPRESSION: 1. The common bile duct is mildly dilated measuring 0.8 cm. This is
a nonspecific finding. Recommend correlation with laboratory values
and if there is concern for biliary obstruction, consider liver MRI.

2.  Cholelithiasis without evidence of cholecystitis.

3. Mildly increased liver parenchymal echogenicity most commonly
seen with hepatic steatosis.

## 2019-09-20 IMAGING — RF DG CHOLANGIOGRAM OPERATIVE
1 series · 2 of 2 positions shown · non-contrast
Comparison: Abdominal ultrasound studies on [DATE] and
[DATE]

CLINICAL DATA: Cholecystectomy for cholelithiasis and
cholecystitis.

EXAM:
INTRAOPERATIVE CHOLANGIOGRAM
TECHNIQUE: Cholangiographic images from the C-arm fluoroscopic device were
submitted for interpretation post-operatively. Please see the
procedural report for the amount of contrast and the fluoroscopy
time utilized.

[Series 1: unknown protocol · 0.20mm/px · 2 of 2 slices shown]
[im 1/2]
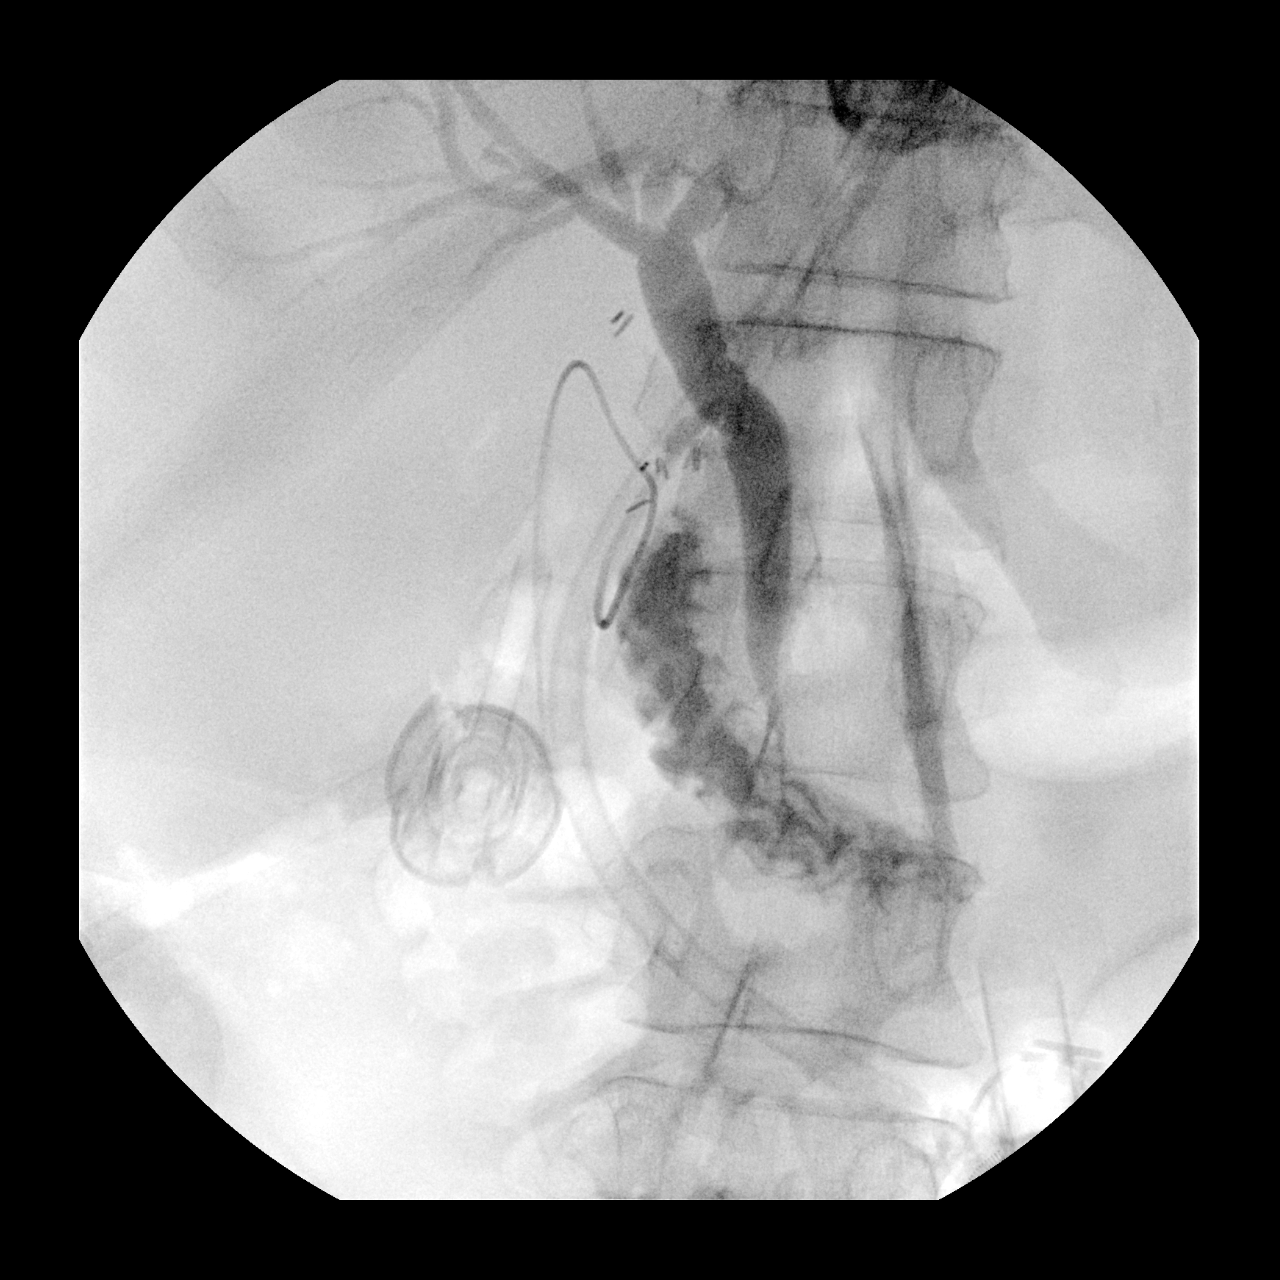
[im 2/2]
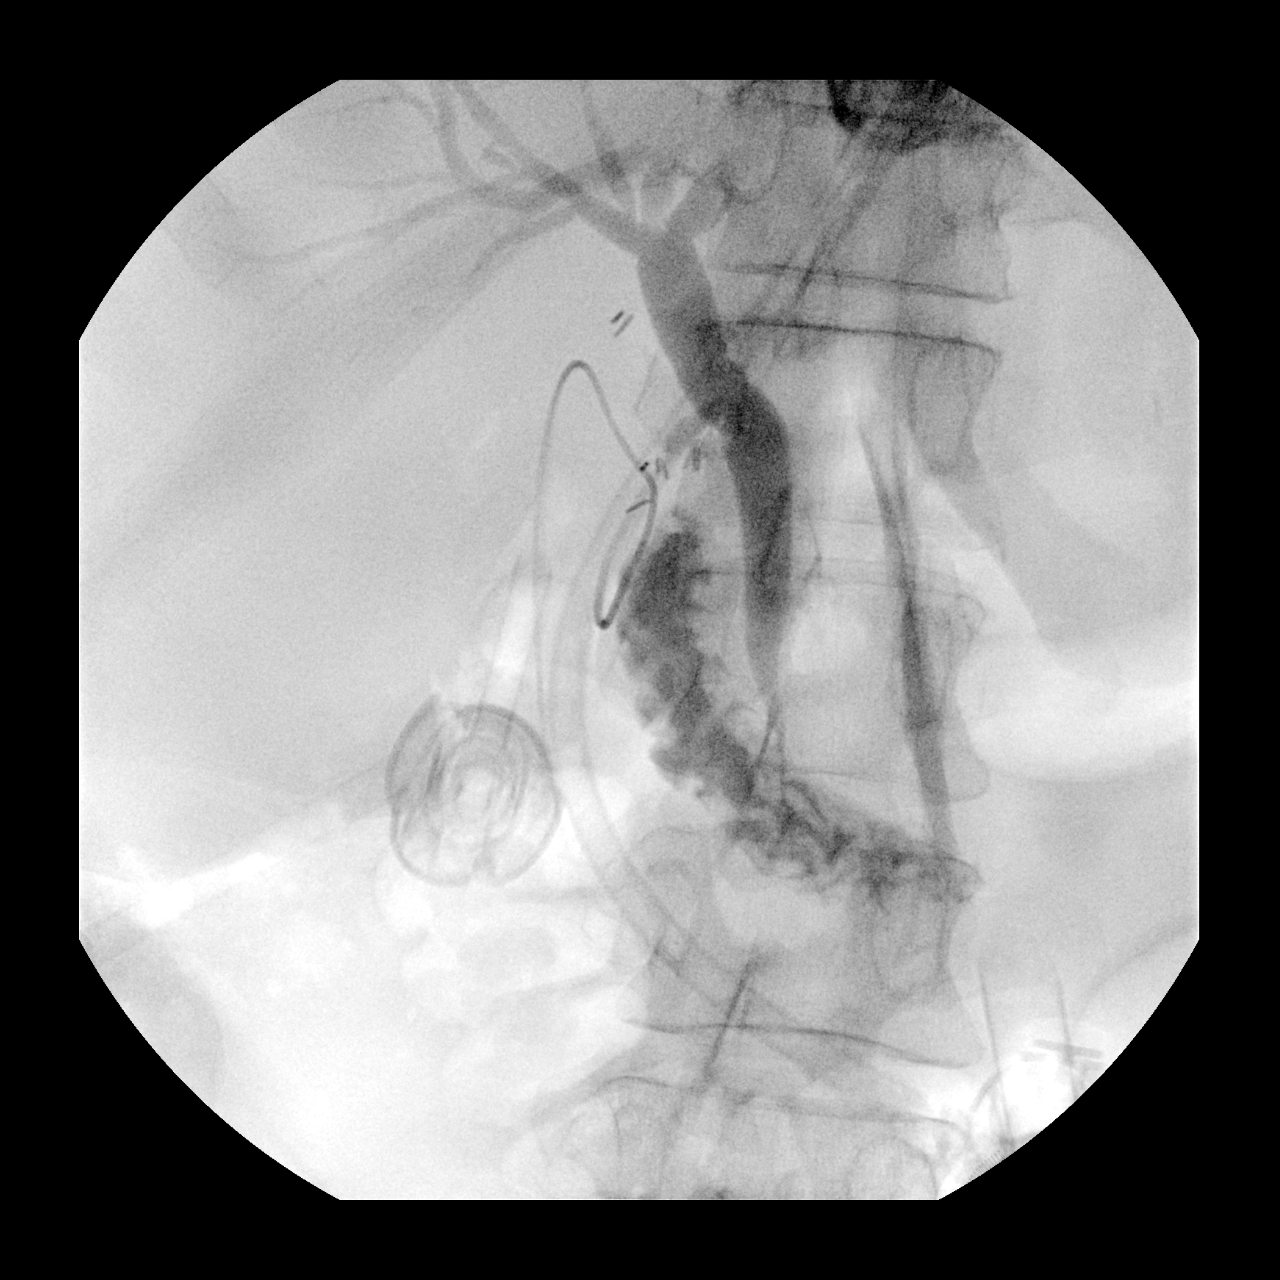

[2 of 2 positions shown; findings below may reference images not displayed]

FINDINGS: Intraoperative imaging of a with a C-arm demonstrates no evidence of
biliary filling defects or obstruction. Contrast is seen in the
duodenum. The common bile duct does not appear overtly dilated.
IMPRESSION: Unremarkable intraoperative cholangiogram.

## 2019-09-20 SURGERY — LAPAROSCOPIC CHOLECYSTECTOMY WITH INTRAOPERATIVE CHOLANGIOGRAM
Anesthesia: General | Site: Abdomen

## 2019-09-20 MED ORDER — SODIUM CHLORIDE 0.9 % IV SOLN
1.0000 g | Freq: Once | INTRAVENOUS | Status: AC
  Administered 2019-09-20: 1 g via INTRAVENOUS
  Filled 2019-09-20: qty 10

## 2019-09-20 MED ORDER — METHOCARBAMOL 500 MG PO TABS: 500.0000 mg | ORAL_TABLET | Freq: Four times a day (QID) | ORAL | Status: DC | PRN

## 2019-09-20 MED ORDER — DIPHENHYDRAMINE HCL 50 MG/ML IJ SOLN: 25.0000 mg | Freq: Four times a day (QID) | INTRAMUSCULAR | Status: DC | PRN

## 2019-09-20 MED ORDER — PROMETHAZINE HCL 25 MG/ML IJ SOLN: 12.5000 mg | Freq: Four times a day (QID) | INTRAMUSCULAR | Status: DC | PRN

## 2019-09-20 MED ORDER — ROCURONIUM BROMIDE 10 MG/ML (PF) SYRINGE
PREFILLED_SYRINGE | INTRAVENOUS | Status: DC | PRN
  Administered 2019-09-20: 10 mg via INTRAVENOUS
  Administered 2019-09-20: 50 mg via INTRAVENOUS
  Administered 2019-09-20: 10 mg via INTRAVENOUS

## 2019-09-20 MED ORDER — HYDROMORPHONE HCL 1 MG/ML IJ SOLN
INTRAMUSCULAR | Status: DC | PRN
  Administered 2019-09-20: .5 mg via INTRAVENOUS

## 2019-09-20 MED ORDER — ONDANSETRON HCL 4 MG/2ML IJ SOLN: 4.0000 mg | Freq: Four times a day (QID) | INTRAMUSCULAR | Status: DC | PRN

## 2019-09-20 MED ORDER — LACTATED RINGERS IV SOLN: INTRAVENOUS | Status: DC

## 2019-09-20 MED ORDER — CEFTRIAXONE SODIUM 1 G IJ SOLR: 1.0000 g | Freq: Once | INTRAMUSCULAR | Status: DC

## 2019-09-20 MED ORDER — SIMETHICONE 80 MG PO CHEW: 40.0000 mg | CHEWABLE_TABLET | Freq: Four times a day (QID) | ORAL | Status: DC | PRN

## 2019-09-20 MED ORDER — FENTANYL CITRATE (PF) 250 MCG/5ML IJ SOLN
INTRAMUSCULAR | Status: DC | PRN
  Administered 2019-09-20 (×2): 50 ug via INTRAVENOUS
  Administered 2019-09-20: 100 ug via INTRAVENOUS
  Administered 2019-09-20 (×3): 50 ug via INTRAVENOUS

## 2019-09-20 MED ORDER — LIDOCAINE 2% (20 MG/ML) 5 ML SYRINGE
INTRAMUSCULAR | Status: DC | PRN
  Administered 2019-09-20: 60 mg via INTRAVENOUS

## 2019-09-20 MED ORDER — FENTANYL CITRATE (PF) 250 MCG/5ML IJ SOLN
INTRAMUSCULAR | Status: AC
  Filled 2019-09-20: qty 5

## 2019-09-20 MED ORDER — 0.9 % SODIUM CHLORIDE (POUR BTL) OPTIME
TOPICAL | Status: DC | PRN
  Administered 2019-09-20: 1000 mL

## 2019-09-20 MED ORDER — BISACODYL 10 MG RE SUPP: 10.0000 mg | Freq: Every day | RECTAL | Status: DC | PRN

## 2019-09-20 MED ORDER — METOPROLOL TARTRATE 5 MG/5ML IV SOLN: 5.0000 mg | Freq: Four times a day (QID) | INTRAVENOUS | Status: DC | PRN

## 2019-09-20 MED ORDER — ONDANSETRON HCL 4 MG/2ML IJ SOLN
INTRAMUSCULAR | Status: DC | PRN
  Administered 2019-09-20: 4 mg via INTRAVENOUS

## 2019-09-20 MED ORDER — MIDAZOLAM HCL 2 MG/2ML IJ SOLN
INTRAMUSCULAR | Status: AC
  Filled 2019-09-20: qty 2

## 2019-09-20 MED ORDER — ENOXAPARIN SODIUM 40 MG/0.4ML ~~LOC~~ SOLN
40.0000 mg | Freq: Every day | SUBCUTANEOUS | Status: DC
  Administered 2019-09-21: 40 mg via SUBCUTANEOUS
  Filled 2019-09-20: qty 0.4

## 2019-09-20 MED ORDER — CIPROFLOXACIN IN D5W 400 MG/200ML IV SOLN
400.0000 mg | INTRAVENOUS | Status: AC
  Administered 2019-09-20: 400 mg via INTRAVENOUS
  Filled 2019-09-20: qty 200

## 2019-09-20 MED ORDER — DOCUSATE SODIUM 100 MG PO CAPS
100.0000 mg | ORAL_CAPSULE | Freq: Two times a day (BID) | ORAL | Status: DC
  Administered 2019-09-20 – 2019-09-21 (×2): 100 mg via ORAL
  Filled 2019-09-20 (×2): qty 1

## 2019-09-20 MED ORDER — POLYETHYLENE GLYCOL 3350 17 G PO PACK: 17.0000 g | PACK | Freq: Every day | ORAL | Status: DC | PRN

## 2019-09-20 MED ORDER — ACETAMINOPHEN 500 MG PO TABS
1000.0000 mg | ORAL_TABLET | Freq: Three times a day (TID) | ORAL | Status: DC
  Administered 2019-09-20 – 2019-09-21 (×2): 1000 mg via ORAL
  Filled 2019-09-20 (×2): qty 2

## 2019-09-20 MED ORDER — PROPOFOL 10 MG/ML IV BOLUS
INTRAVENOUS | Status: DC | PRN
  Administered 2019-09-20: 30 mg via INTRAVENOUS
  Administered 2019-09-20: 150 mg via INTRAVENOUS

## 2019-09-20 MED ORDER — HYDROMORPHONE HCL 1 MG/ML IJ SOLN
INTRAMUSCULAR | Status: AC
  Filled 2019-09-20: qty 0.5

## 2019-09-20 MED ORDER — CHLORHEXIDINE GLUCONATE 0.12 % MT SOLN
15.0000 mL | Freq: Once | OROMUCOSAL | Status: AC
  Filled 2019-09-20: qty 15

## 2019-09-20 MED ORDER — ONDANSETRON 4 MG PO TBDP: 4.0000 mg | ORAL_TABLET | Freq: Four times a day (QID) | ORAL | Status: DC | PRN

## 2019-09-20 MED ORDER — BUPIVACAINE HCL (PF) 0.25 % IJ SOLN
INTRAMUSCULAR | Status: AC
  Filled 2019-09-20: qty 30

## 2019-09-20 MED ORDER — OXYCODONE HCL 5 MG PO TABS
5.0000 mg | ORAL_TABLET | ORAL | Status: DC | PRN
  Administered 2019-09-20 – 2019-09-21 (×3): 10 mg via ORAL
  Filled 2019-09-20 (×3): qty 2

## 2019-09-20 MED ORDER — SUGAMMADEX SODIUM 200 MG/2ML IV SOLN
INTRAVENOUS | Status: DC | PRN
  Administered 2019-09-20: 250 mg via INTRAVENOUS

## 2019-09-20 MED ORDER — DEXAMETHASONE SODIUM PHOSPHATE 10 MG/ML IJ SOLN
INTRAMUSCULAR | Status: DC | PRN
  Administered 2019-09-20: 4 mg via INTRAVENOUS

## 2019-09-20 MED ORDER — SERTRALINE HCL 50 MG PO TABS
150.0000 mg | ORAL_TABLET | Freq: Every day | ORAL | Status: DC
  Administered 2019-09-20 – 2019-09-21 (×2): 150 mg via ORAL
  Filled 2019-09-20 (×2): qty 1

## 2019-09-20 MED ORDER — DIPHENHYDRAMINE HCL 25 MG PO CAPS: 25.0000 mg | ORAL_CAPSULE | Freq: Four times a day (QID) | ORAL | Status: DC | PRN

## 2019-09-20 MED ORDER — MIDAZOLAM HCL 5 MG/5ML IJ SOLN
INTRAMUSCULAR | Status: DC | PRN
  Administered 2019-09-20: 2 mg via INTRAVENOUS

## 2019-09-20 MED ORDER — SODIUM CHLORIDE 0.9 % IV BOLUS
500.0000 mL | Freq: Once | INTRAVENOUS | Status: AC
  Administered 2019-09-20: 500 mL via INTRAVENOUS

## 2019-09-20 MED ORDER — BUPIVACAINE HCL (PF) 0.25 % IJ SOLN
INTRAMUSCULAR | Status: DC | PRN
  Administered 2019-09-20: 21 mL

## 2019-09-20 MED ORDER — SODIUM CHLORIDE 0.9 % IR SOLN
Status: DC | PRN
  Administered 2019-09-20: 1000 mL

## 2019-09-20 MED ORDER — CHLORHEXIDINE GLUCONATE 0.12 % MT SOLN
OROMUCOSAL | Status: AC
  Administered 2019-09-20: 15 mL via OROMUCOSAL
  Filled 2019-09-20: qty 15

## 2019-09-20 MED ORDER — HYDROMORPHONE HCL 1 MG/ML IJ SOLN: 1.0000 mg | INTRAMUSCULAR | Status: DC | PRN

## 2019-09-20 MED ORDER — ALBUTEROL SULFATE (2.5 MG/3ML) 0.083% IN NEBU: 2.5000 mg | INHALATION_SOLUTION | Freq: Four times a day (QID) | RESPIRATORY_TRACT | Status: DC | PRN

## 2019-09-20 MED ORDER — SODIUM CHLORIDE 0.9 % IV SOLN
INTRAVENOUS | Status: DC | PRN
  Administered 2019-09-20: 20 mL

## 2019-09-20 MED ORDER — SODIUM CHLORIDE 0.9 % IV SOLN: INTRAVENOUS | Status: DC | PRN

## 2019-09-20 MED ORDER — ONDANSETRON HCL 4 MG/2ML IJ SOLN
INTRAMUSCULAR | Status: AC
  Filled 2019-09-20: qty 2

## 2019-09-20 MED ORDER — SODIUM CHLORIDE 0.45 % IV SOLN: INTRAVENOUS | Status: DC

## 2019-09-20 MED ORDER — BUPROPION HCL 100 MG PO TABS
300.0000 mg | ORAL_TABLET | Freq: Every day | ORAL | Status: DC
  Administered 2019-09-20 – 2019-09-21 (×2): 300 mg via ORAL
  Filled 2019-09-20 (×2): qty 3

## 2019-09-20 MED ORDER — PROPOFOL 10 MG/ML IV BOLUS
INTRAVENOUS | Status: AC
  Filled 2019-09-20: qty 20

## 2019-09-20 MED ORDER — CIPROFLOXACIN IN D5W 400 MG/200ML IV SOLN
INTRAVENOUS | Status: AC
  Filled 2019-09-20: qty 200

## 2019-09-20 SURGICAL SUPPLY — 42 items
APPLIER CLIP 5 13 M/L LIGAMAX5 (MISCELLANEOUS) ×6
BLADE CLIPPER SURG (BLADE) IMPLANT
CANISTER SUCT 3000ML PPV (MISCELLANEOUS) ×3 IMPLANT
CHLORAPREP W/TINT 26 (MISCELLANEOUS) ×3 IMPLANT
CLIP APPLIE 5 13 M/L LIGAMAX5 (MISCELLANEOUS) ×2 IMPLANT
COVER MAYO STAND STRL (DRAPES) ×3 IMPLANT
COVER SURGICAL LIGHT HANDLE (MISCELLANEOUS) ×3 IMPLANT
DERMABOND ADVANCED (GAUZE/BANDAGES/DRESSINGS) ×2
DERMABOND ADVANCED .7 DNX12 (GAUZE/BANDAGES/DRESSINGS) ×1 IMPLANT
DRAPE C-ARM 42X120 X-RAY (DRAPES) ×3 IMPLANT
ELECT REM PT RETURN 9FT ADLT (ELECTROSURGICAL) ×3
ELECTRODE REM PT RTRN 9FT ADLT (ELECTROSURGICAL) ×1 IMPLANT
GLOVE BIO SURGEON STRL SZ8 (GLOVE) ×3 IMPLANT
GLOVE BIOGEL PI IND STRL 8 (GLOVE) ×1 IMPLANT
GLOVE BIOGEL PI INDICATOR 8 (GLOVE) ×2
GOWN STRL REUS W/ TWL LRG LVL3 (GOWN DISPOSABLE) ×5 IMPLANT
GOWN STRL REUS W/ TWL XL LVL3 (GOWN DISPOSABLE) ×1 IMPLANT
GOWN STRL REUS W/TWL LRG LVL3 (GOWN DISPOSABLE) ×10
GOWN STRL REUS W/TWL XL LVL3 (GOWN DISPOSABLE) ×2
KIT BASIN OR (CUSTOM PROCEDURE TRAY) ×3 IMPLANT
KIT TURNOVER KIT B (KITS) ×3 IMPLANT
L-HOOK LAP DISP 36CM (ELECTROSURGICAL) ×3
LHOOK LAP DISP 36CM (ELECTROSURGICAL) ×1 IMPLANT
NEEDLE 22X1 1/2 (OR ONLY) (NEEDLE) ×3 IMPLANT
NS IRRIG 1000ML POUR BTL (IV SOLUTION) ×3 IMPLANT
PAD ARMBOARD 7.5X6 YLW CONV (MISCELLANEOUS) ×3 IMPLANT
PENCIL BUTTON HOLSTER BLD 10FT (ELECTRODE) ×3 IMPLANT
POUCH RETRIEVAL ECOSAC 10 (ENDOMECHANICALS) ×1 IMPLANT
POUCH RETRIEVAL ECOSAC 10MM (ENDOMECHANICALS) ×2
SCISSORS LAP 5X35 DISP (ENDOMECHANICALS) ×3 IMPLANT
SET CHOLANGIOGRAPH 5 50 .035 (SET/KITS/TRAYS/PACK) ×3 IMPLANT
SET IRRIG TUBING LAPAROSCOPIC (IRRIGATION / IRRIGATOR) ×3 IMPLANT
SET TUBE SMOKE EVAC HIGH FLOW (TUBING) ×3 IMPLANT
SLEEVE ENDOPATH XCEL 5M (ENDOMECHANICALS) ×6 IMPLANT
SPECIMEN JAR SMALL (MISCELLANEOUS) ×3 IMPLANT
SUT VIC AB 4-0 PS2 27 (SUTURE) ×3 IMPLANT
TOWEL GREEN STERILE (TOWEL DISPOSABLE) ×3 IMPLANT
TOWEL GREEN STERILE FF (TOWEL DISPOSABLE) ×3 IMPLANT
TRAY LAPAROSCOPIC MC (CUSTOM PROCEDURE TRAY) ×3 IMPLANT
TROCAR XCEL BLUNT TIP 100MML (ENDOMECHANICALS) ×3 IMPLANT
TROCAR XCEL NON-BLD 5MMX100MML (ENDOMECHANICALS) ×3 IMPLANT
WATER STERILE IRR 1000ML POUR (IV SOLUTION) ×3 IMPLANT

## 2019-09-20 NOTE — Anesthesia Procedure Notes (Signed)
Procedure Name: Intubation Date/Time: 09/20/2019 2:24 PM Performed by: Waynard Edwards, CRNA Pre-anesthesia Checklist: Patient identified, Emergency Drugs available, Suction available and Patient being monitored Patient Re-evaluated:Patient Re-evaluated prior to induction Oxygen Delivery Method: Circle system utilized Preoxygenation: Pre-oxygenation with 100% oxygen Induction Type: IV induction Ventilation: Mask ventilation without difficulty Laryngoscope Size: Miller and 2 Grade View: Grade I Tube type: Oral Tube size: 7.0 mm Number of attempts: 1 Airway Equipment and Method: Stylet Placement Confirmation: ETT inserted through vocal cords under direct vision,  positive ETCO2 and breath sounds checked- equal and bilateral Secured at: 22 cm Tube secured with: Tape Dental Injury: Teeth and Oropharynx as per pre-operative assessment

## 2019-09-20 NOTE — Transfer of Care (Signed)
Immediate Anesthesia Transfer of Care Note  Patient: Hailey Bowman  Procedure(s) Performed: LAPAROSCOPIC CHOLECYSTECTOMY WITH INTRAOPERATIVE CHOLANGIOGRAM (N/A Abdomen)  Patient Location: PACU  Anesthesia Type:General  Level of Consciousness: awake, alert , oriented and patient cooperative  Airway & Oxygen Therapy: Patient Spontanous Breathing and Patient connected to nasal cannula oxygen  Post-op Assessment: Report given to RN and Post -op Vital signs reviewed and stable  Post vital signs: Reviewed and stable  Last Vitals:  Vitals Value Taken Time  BP 177/101 09/20/19 1603  Temp    Pulse 91 09/20/19 1604  Resp 19 09/20/19 1604  SpO2 95 % 09/20/19 1604  Vitals shown include unvalidated device data.  Last Pain:  Vitals:   09/20/19 1401  TempSrc:   PainSc: 0-No pain      Patients Stated Pain Goal: 3 (09/20/19 1401)  Complications: No complications documented.

## 2019-09-20 NOTE — Anesthesia Postprocedure Evaluation (Signed)
Anesthesia Post Note  Patient: Hailey Bowman  Procedure(s) Performed: LAPAROSCOPIC CHOLECYSTECTOMY WITH INTRAOPERATIVE CHOLANGIOGRAM (N/A Abdomen)     Patient location during evaluation: PACU Anesthesia Type: General Level of consciousness: awake and alert Pain management: pain level controlled Vital Signs Assessment: post-procedure vital signs reviewed and stable Respiratory status: spontaneous breathing, nonlabored ventilation, respiratory function stable and patient connected to nasal cannula oxygen Cardiovascular status: blood pressure returned to baseline and stable Postop Assessment: no apparent nausea or vomiting Anesthetic complications: no   No complications documented.  Last Vitals:  Vitals:   09/20/19 1700 09/20/19 1715  BP:    Pulse: 77 86  Resp: (!) 9 17  Temp:    SpO2: 96% 95%    Last Pain:  Vitals:   09/20/19 1604  TempSrc:   PainSc: 0-No pain                 Kennieth Rad

## 2019-09-20 NOTE — Discharge Instructions (Signed)
[facility] ° °Please arrive at least 30 min before your appointment to complete your check in paperwork.  If you are unable to arrive 30 min prior to your appointment time we may have to cancel or reschedule you. °LAPAROSCOPIC SURGERY: POST OP INSTRUCTIONS °Always review your discharge instruction sheet given to you by the facility where your surgery was performed. °IF YOU HAVE DISABILITY OR FAMILY LEAVE FORMS, YOU MUST BRING THEM TO THE OFFICE FOR PROCESSING.   °DO NOT GIVE THEM TO YOUR DOCTOR. ° °PAIN CONTROL ° °1. First take acetaminophen (Tylenol) AND/or ibuprofen (Advil) to control your pain after surgery.  Follow directions on package.  Taking acetaminophen (Tylenol) and/or ibuprofen (Advil) regularly after surgery will help to control your pain and lower the amount of prescription pain medication you may need.  You should not take more than 4,000 mg (4 grams) of acetaminophen (Tylenol) in 24 hours.  You should not take ibuprofen (Advil), aleve, motrin, naprosyn or other NSAIDS if you have a history of stomach ulcers or chronic kidney disease.  °2. A prescription for pain medication may be given to you upon discharge.  Take your pain medication as prescribed, if you still have uncontrolled pain after taking acetaminophen (Tylenol) or ibuprofen (Advil). °3. Use ice packs to help control pain. °4. If you need a refill on your pain medication, please contact your pharmacy.  They will contact our office to request authorization. Prescriptions will not be filled after 5pm or on week-ends. ° °HOME MEDICATIONS °5. Take your usually prescribed medications unless otherwise directed. ° °DIET °6. You should follow a light diet the first few days after arrival home.  Be sure to include lots of fluids daily. Avoid fatty, fried foods.  ° °CONSTIPATION °7. It is common to experience some constipation after surgery and if you are taking pain medication.  Increasing fluid intake and taking a stool  softener (such as Colace) will usually help or prevent this problem from occurring.  A mild laxative (Milk of Magnesia or Miralax) should be taken according to package instructions if there are no bowel movements after 48 hours. ° °WOUND/INCISION CARE °8. Most patients will experience some swelling and bruising in the area of the incisions.  Ice packs will help.  Swelling and bruising can take several days to resolve.  °9. Unless discharge instructions indicate otherwise, follow guidelines below  °a. STERI-STRIPS - you may remove your outer bandages 48 hours after surgery, and you may shower at that time.  You have steri-strips (small skin tapes) in place directly over the incision.  These strips should be left on the skin for 7-10 days.   °b. DERMABOND/SKIN GLUE - you may shower in 24 hours.  The glue will flake off over the next 2-3 weeks. °10. Any sutures or staples will be removed at the office during your follow-up visit. ° °ACTIVITIES °11. You may resume regular (light) daily activities beginning the next day--such as daily self-care, walking, climbing stairs--gradually increasing activities as tolerated.  You may have sexual intercourse when it is comfortable.  Refrain from any heavy lifting or straining until approved by your doctor. °a. You may drive when you are no longer taking prescription pain medication, you can comfortably wear a seatbelt, and you can safely maneuver your car and apply brakes. ° °FOLLOW-UP °12. You should see your doctor in the office for a follow-up appointment approximately 2-3 weeks after your surgery.  You should have been given your post-op/follow-up appointment when   your surgery was scheduled.  If you did not receive a post-op/follow-up appointment, make sure that you call for this appointment within a day or two after you arrive home to insure a convenient appointment time. ° °OTHER INSTRUCTIONS ° °WHEN TO CALL YOUR DOCTOR: °1. Fever over 101.0 °2. Inability to  urinate °3. Continued bleeding from incision. °4. Increased pain, redness, or drainage from the incision. °5. Increasing abdominal pain ° °The clinic staff is available to answer your questions during regular business hours.  Please don’t hesitate to call and ask to speak to one of the nurses for clinical concerns.  If you have a medical emergency, go to the nearest emergency room or call 911.  A surgeon from [facility] is always on call at the hospital. °[address], [address], [place], [place]  [address] ? [address] [address], [place], [place]   [address] °[phone] ? [phone] ? FAX [phone] ° ° ° °

## 2019-09-20 NOTE — ED Notes (Signed)
Inform Consent signed and left at bedside

## 2019-09-20 NOTE — H&P (Signed)
[facility] Surgery Admission Note  Hailey Bowman January 24, 1971  [id number].    Requesting MD: Estell Harpin, MD Chief Complaint/Reason for Consult: epigastric pain, cholelithiasis   HPI: [name] is a 49 y/o female with a PMH obesity, tobacco use, depression/anxiety, and cholelithiasis who presented to the ED with a cc upper abdominal pain and nausea/vomiting. Onset of pain was 5 days ago and described as sharp, non-radiating, epigastric pain associated with emesis. Pain lasted about 24 hours and was only temporarily relieved after she vomiting. She reports intermittent pain since that time that became acutely worse last night, bringing her back to the ED. She tried taking pepto bismol and tylenol without relief. She denies eating new foods and is unsure if her symptoms are related to her meals. She reports similar pain and nausea/vomiting around September of last year which she attributed to side effects of starting new medications (zoloft/wellbutrin). Previous episodes of pain and nausea were much less frequent and only lasted 30 minutes. Patient denies a history of gastritis, pancreatitis, or stomach ulcers. Reports dark stools yesterday. Denies hematemesis/hematochezia. Denies urinary frequency, dysuria. She is currently on her period. She is a stay at home mom. Reports smoking 1 ppd cigarettes and using marijuana daily. Denies regular alcohol use. Denies taking narcotic medications, cocaine, or heroin. Denies history of abdominal surgery. Reports she gets hives when she takes ibuprofen.   ED workup: afebrile, hypertensive (184/99), HR 80-115 bpm, WBC 16,000, UA contaminated by menstrual blood,   ROS: Review of Systems  All other systems reviewed and are negative.  Family History  Problem Relation Age of Onset  . Breast cancer Mother   . Diabetes Other        GF?  . CAD Other        GM CHF,CAD  . Colon cancer Neg Hx     Past Medical History:  Diagnosis Date  .  Anxiety   . Asthma   . Contraceptive use    condoms  . Depression   . History of UTI    as a child     Past Surgical History:  Procedure Laterality Date  . DILATION AND CURETTAGE OF UTERUS      Social History:  reports that she has been smoking cigarettes. She has never used smokeless tobacco. She reports current alcohol use. She reports current drug use. Drug: Marijuana.  Allergies:  Allergies  Allergen Reactions  . Ibuprofen Hives  . Penicillins Other (See Comments)    Childhood unk     (Not in a hospital admission)   Blood pressure (!) 184/99, pulse (!) 115, temperature 98.1 F (36.7 C), temperature source Oral, resp. rate 20, height 5\' 9"  (1.753 m), weight 113 kg, SpO2 98 %. Physical Exam: Constitutional: NAD; conversant; no deformities Eyes: Moist conjunctiva; no lid lag; anicteric; PERRL Neck: Trachea midline; no thyromegaly Lungs: Normal respiratory effort; no tactile fremitus CV: RRR; no palpable thrills; no pitting edema GI: Abd soft, obese, non-tender, no palpable hepatosplenomegaly MSK: Normal gait; no clubbing/cyanosis Psychiatric: Appropriate affect; alert and oriented x3 Lymphatic: No palpable cervical or axillary lymphadenopathy  Results for orders placed or performed during the hospital encounter of 09/19/19 (from the past 48 hour(s))  Urinalysis, Routine w reflex microscopic     Status: Abnormal   Collection Time: 09/19/19  8:48 PM  Result Value Ref Range   Color, Urine RED (A) YELLOW    Comment: BIOCHEMICALS MAY BE AFFECTED BY COLOR   APPearance TURBID (A) CLEAR   Specific Gravity,  Urine  1.005 - 1.030    TEST NOT REPORTED DUE TO COLOR INTERFERENCE OF URINE PIGMENT   pH  5.0 - 8.0    TEST NOT REPORTED DUE TO COLOR INTERFERENCE OF URINE PIGMENT   Glucose, UA (A) NEGATIVE mg/dL    TEST NOT REPORTED DUE TO COLOR INTERFERENCE OF URINE PIGMENT   Hgb urine dipstick (A) NEGATIVE    TEST NOT REPORTED DUE TO COLOR INTERFERENCE OF URINE PIGMENT    Bilirubin Urine (A) NEGATIVE    TEST NOT REPORTED DUE TO COLOR INTERFERENCE OF URINE PIGMENT   Ketones, ur (A) NEGATIVE mg/dL    TEST NOT REPORTED DUE TO COLOR INTERFERENCE OF URINE PIGMENT   Protein, ur (A) NEGATIVE mg/dL    TEST NOT REPORTED DUE TO COLOR INTERFERENCE OF URINE PIGMENT   Nitrite (A) NEGATIVE    TEST NOT REPORTED DUE TO COLOR INTERFERENCE OF URINE PIGMENT   Leukocytes,Ua (A) NEGATIVE    TEST NOT REPORTED DUE TO COLOR INTERFERENCE OF URINE PIGMENT    Comment: Performed at [facility], [address], [place], [place] [address]  Urinalysis, Microscopic (reflex)     Status: Abnormal   Collection Time: 09/19/19  8:48 PM  Result Value Ref Range   RBC / HPF >50 0 - 5 RBC/hpf   WBC, UA >50 0 - 5 WBC/hpf   Bacteria, UA MANY (A) NONE SEEN   Squamous Epithelial / LPF 0-5 0 - 5    Comment: Performed at [facility], [address], [place], [place] [address]  Lipase, blood     Status: None   Collection Time: 09/19/19  9:15 PM  Result Value Ref Range   Lipase 24 11 - 51 U/L    Comment: Performed at [facility], [address] [place], [place] [address]  Comprehensive metabolic panel     Status: Abnormal   Collection Time: 09/19/19  9:15 PM  Result Value Ref Range   Sodium 139 135 - 145 mmol/L   Potassium 3.4 (L) 3.5 - 5.1 mmol/L   Chloride 106 98 - 111 mmol/L   CO2 18 (L) 22 - 32 mmol/L   Glucose, Bld 122 (H) 70 - 99 mg/dL    Comment: Glucose reference range applies only to samples taken after fasting for at least 8 hours.   BUN 8 6 - 20 mg/dL   Creatinine, Ser 3.57 0.44 - 1.00 mg/dL   Calcium 8.8 (L) 8.9 - 10.3 mg/dL   Total Protein 7.8 6.5 - 8.1 g/dL   Albumin 4.0 3.5 - 5.0 g/dL   AST 26 15 - 41 U/L   ALT 23 0 - 44 U/L   Alkaline Phosphatase 61 38 - 126 U/L   Total Bilirubin 1.5 (H) 0.3 - 1.2 mg/dL   GFR calc non Af Amer >60 >60 mL/min   GFR calc Af Amer >60 >60 mL/min   Anion gap 15 5 - 15    Comment: Performed at [facility], [address] [place], [place] [address]  CBC     Status: Abnormal   Collection Time: 09/19/19  9:15 PM  Result Value Ref Range   WBC 16.0 (H) 4.0 - 10.5 K/uL   RBC 5.10 3.87 - 5.11 MIL/uL   Hemoglobin 15.2 (H) 12.0 - 15.0 g/dL   HCT 39.0 36 - 46 %   MCV 87.1 80.0 - 100.0 fL   MCH 29.8 26.0 - 34.0 pg   MCHC 34.2 30.0 -  36.0 g/dL   RDW 15.1 76.1 - 60.7 %   Platelets 332 150 - 400 K/uL   nRBC 0.0 0.0 - 0.2 %    Comment: Performed at [facility], [address], [place], [place] [address]  I-Stat beta hCG blood, ED     Status: None   Collection Time: 09/19/19  9:30 PM  Result Value Ref Range   I-stat hCG, quantitative <5.0 <5 mIU/mL   Comment 3            Comment:   GEST. AGE      CONC.  (mIU/mL)   <=1 WEEK        5 - 50     2 WEEKS       50 - 500     3 WEEKS       100 - 10,000     4 WEEKS     1,000 - 30,000        FEMALE AND NON-PREGNANT FEMALE:     LESS THAN 5 mIU/mL    US Abdomen Limited RUQ  Result Date: 09/18/2019 CLINICAL DATA:  Right upper quadrant pain EXAM: ULTRASOUND ABDOMEN LIMITED RIGHT UPPER QUADRANT COMPARISON:  None. FINDINGS: Gallbladder: Cholelithiasis is noted. Gallbladder is decompressed contributing to wall thickening. No true wall thickening or pericholecystic fluid is noted. Common bile duct: Diameter: 4.5 mm. Liver: Diffuse increased echogenicity is noted consistent with fatty infiltration. Portal vein is patent on color Doppler imaging with normal direction of blood flow towards the liver. Other: None. IMPRESSION: Fatty liver. Contracted gallbladder with cholelithiasis. Electronically Signed   By: Alcide Clever M.D.   On: 09/18/2019 19:10   Assessment/Plan Obesity Depression/anxiety Tobacco abuse   Symptomatic cholelithiasis, suspect acute cholecystitis  - AFVSS, WBC 16 - RUQ U/S  - Plan laparoscopic cholecystectomy with cholangiogram. I discussed the procedure, risks, and benefits. I discussed the expected post-op course. She agrees. IV Cipro.  Adam Phenix, PA-C [facility] Please see Amion for pager number during day hours 7:00am-4:30pm  Violeta Gelinas, MD, MPH, FACS Please use [email or web address] to contact on call provider  09/20/2019, 10:17 AM

## 2019-09-20 NOTE — ED Notes (Signed)
Report given, patient to go to [facility]

## 2019-09-20 NOTE — Op Note (Signed)
09/19/2019 - 09/20/2019  3:38 PM  PATIENT:  Hailey Bowman  49 y.o. female  PRE-OPERATIVE DIAGNOSIS:  cholecystitis  POST-OPERATIVE DIAGNOSIS:  cholecystitis  PROCEDURE:  Procedure(s): LAPAROSCOPIC CHOLECYSTECTOMY WITH INTRAOPERATIVE CHOLANGIOGRAM  SURGEON:  Surgeon(s): Violeta Gelinas, MD  ASSISTANTS: Bailey Mech, PA-C   ANESTHESIA:   local and general  EBL:  Total I/O In: 800 [IV Piggyback:800] Out: -   BLOOD ADMINISTERED:none  DRAINS: none   SPECIMEN:  Excision  DISPOSITION OF SPECIMEN:  PATHOLOGY  COUNTS:  YES  DICTATION: .Dragon Dictation Findings: Some chronic inflammation of the gallbladder. Dilated common bile duct and common hepatic duct but no filling defects on cholangiogram. Contrast flowed easily to the duodenum.  Procedure in detail. Informed consent was obtained. She received intravenous antibiotics. She was brought to the operating room and general endotracheal anesthesia was administered by the anesthesia staff. Her abdomen was prepped and draped in a sterile fashion. We did a timeout procedure.The infraumbilical region was infiltrated with local. Infraumbilical incision was made. Subcutaneous tissues were dissected down revealing the anterior fascia. This was divided sharply along the midline. Peritoneal cavity was entered under direct vision without complication. A 0 Vicryl pursestring was placed around the fascial opening. Hassan trocar was inserted into the abdomen. The abdomen was insufflated with carbon dioxide in standard fashion. Under direct vision a 5 mm epigastric and 5 mm right abdominal port x2 were placed. Local was used at each port site. Laparoscopic exploration revealed evidence of some chronic inflammation of the gallbladder with adhesions to the omentum. These were taken down carefully. The dome of the gallbladder was then retracted superior medially. The infundibulum was retracted inferior laterally. Dissection began laterally and  progressed medially identifying the cystic duct. The common bile duct was visible and was not enlarged. The cystic duct was a little bit short but I was able to dissect around until we had a critical view of safety. I also located an anterior branch of the cystic artery. This was clipped twice proximally and divided distally with cautery. Next, I placed a clip at the infundibular cystic duct junction. A small nick was placed in the cystic duct and I inserted a cholangiogram catheter. Intraoperative cholangiogram was performed demonstrated a dilated biliary tree including the common bile duct and common hepatic duct. Contrast, however, flowed easily into the duodenum with no visible filling defect. The catheter was removed and 3 clips were placed proximally on the cystic duct. It was divided. The gallbladder was then dissected off the liver bed. I encountered a posterior branch of the cystic artery which was clipped twice proximally. It was divided distally with cautery. The gallbladder was removed the rest of the way from the liver bed achieving good hemostasis. The gallbladder was placed in a bag and taken out of the abdomen. It was sent to pathology. The liver bed was irrigated and hemostasis was ensured with cautery. Clips all remain in good position. Liver bed was dry. Ports were removed under direct vision. Pneumoperitoneum was released. Infraumbilical fascia was closed by tying the pursestring. All 4 wounds were irrigated and the skin of each was closed with 4-0 Vicryl followed by Dermabond. All counts were correct. She tolerated the procedure well without apparent complication and was taken recovery in stable condition.  PATIENT DISPOSITION:  PACU - hemodynamically stable.   Delay start of Pharmacological VTE agent (>24hrs) due to surgical blood loss or risk of bleeding:  no  Violeta Gelinas, MD, MPH, FACS Pager: [phone]  7/8/20213:38 PM

## 2019-09-20 NOTE — ED Provider Notes (Signed)
[facility] EMERGENCY DEPARTMENT Provider Note   CSN: [id number] Arrival date & time: 09/19/19  2037     History Chief Complaint  Patient presents with  . Abdominal Pain    Hailey Bowman is a 49 y.o. female.  Patient complains of abdominal pain.  Patient states since Saturday she has had 4 episodes of abdominal pain lasting anywhere from 4 to 12 hours.  She had an ultrasound done 2 days ago that showed gallstones.  Presently she was having pain for 12 hours today but when I saw her the pain had improved.  The history is provided by the patient.  Abdominal Pain Pain location:  Epigastric Pain quality: aching   Pain radiates to:  Does not radiate Pain severity:  Moderate Onset quality:  Sudden Timing:  Constant Progression:  Worsening Chronicity:  Recurrent Context: not alcohol use   Relieved by:  Nothing Worsened by:  Nothing Ineffective treatments:  None tried Associated symptoms: anorexia   Associated symptoms: no chest pain, no cough, no diarrhea, no fatigue and no hematuria        Past Medical History:  Diagnosis Date  . Anxiety   . Asthma   . Contraceptive use    condoms  . Depression   . History of UTI    as a child     Patient Active Problem List   Diagnosis Date Noted  . Allergic rhinitis 08/23/2013  . Otitis media 03/09/2012  . Tobacco abuse 03/09/2012  . Asthma   . ANXIETY 11/30/2006    Past Surgical History:  Procedure Laterality Date  . DILATION AND CURETTAGE OF UTERUS       OB History   No obstetric history on file.     Family History  Problem Relation Age of Onset  . Breast cancer Mother   . Diabetes Other        GF?  . CAD Other        GM CHF,CAD  . Colon cancer Neg Hx     Social History   Tobacco Use  . Smoking status: Current Every Day Smoker    Types: Cigarettes  . Smokeless tobacco: Never Used  . Tobacco comment: 1 ppd  Vaping Use  . Vaping Use: Never used  Substance Use Topics  .  Alcohol use: Yes    Comment: occ  . Drug use: Yes    Types: Marijuana    Home Medications Prior to Admission medications   Medication Sig Start Date End Date Taking? Authorizing Provider  acetaminophen (TYLENOL) 500 MG tablet Take 500-1,000 mg by mouth daily as needed for mild pain or headache.   Yes [provider]  albuterol (PROAIR HFA) 108 (90 BASE) MCG/ACT inhaler Inhale 2 puffs into the lungs every 6 (six) hours as needed for wheezing or shortness of breath. 01/10/15  Yes Paz, Nolon Rod, MD  buPROPion (WELLBUTRIN) 100 MG tablet Take 1 tablet (100 mg total) by mouth 2 (two) times daily. Patient taking differently: Take 300 mg by mouth daily.  07/16/14  Yes Paz, Nolon Rod, MD  famotidine (PEPCID) 20 MG tablet Take 1 tablet (20 mg total) by mouth 2 (two) times daily for 5 days. 09/18/19 09/23/19 Yes Joy, Shawn C, PA-C  ondansetron (ZOFRAN ODT) 4 MG disintegrating tablet Take 1 tablet (4 mg total) by mouth every 8 (eight) hours as needed for nausea or vomiting. 09/18/19  Yes Sabas Sous, MD  pantoprazole (PROTONIX) 20 MG tablet Take 1 tablet (20 mg  total) by mouth daily. 09/18/19 11/17/19 Yes Joy, Shawn C, PA-C  potassium chloride SA (KLOR-CON) 20 MEQ tablet Take 1 tablet (20 mEq total) by mouth 2 (two) times daily for 3 days. 09/18/19 09/21/19 Yes Bero, Elmer Sow, MD  promethazine (PHENERGAN) 25 MG tablet Take 1 tablet (25 mg total) by mouth every 6 (six) hours as needed for nausea or vomiting. 09/18/19  Yes Joy, Shawn C, PA-C  sertraline (ZOLOFT) 50 MG tablet Take 150 mg by mouth daily.   Yes [provider]    Allergies    Ibuprofen and Penicillins  Review of Systems   Review of Systems  Constitutional: Negative for appetite change and fatigue.  HENT: Negative for congestion, ear discharge and sinus pressure.   Eyes: Negative for discharge.  Respiratory: Negative for cough.   Cardiovascular: Negative for chest pain.  Gastrointestinal: Positive for abdominal pain and anorexia.  Negative for diarrhea.  Genitourinary: Negative for frequency and hematuria.  Musculoskeletal: Negative for back pain.  Skin: Negative for rash.  Neurological: Negative for seizures and headaches.  Psychiatric/Behavioral: Negative for hallucinations.    Physical Exam Updated Vital Signs BP (!) 184/99 (BP Location: Right Arm)   Pulse (!) 115   Temp 98.1 F (36.7 C) (Oral)   Resp 20   Ht 5\' 9"  (1.753 m)   Wt 113 kg   SpO2 98%   BMI 36.79 kg/m   Physical Exam Vitals reviewed.  Constitutional:      Appearance: She is well-developed.  HENT:     Head: Normocephalic.     Nose: Nose normal.  Eyes:     General: No scleral icterus.    Conjunctiva/sclera: Conjunctivae normal.  Neck:     Thyroid: No thyromegaly.  Cardiovascular:     Rate and Rhythm: Normal rate and regular rhythm.     Heart sounds: No murmur heard.  No friction rub. No gallop.   Pulmonary:     Breath sounds: No stridor. No wheezing or rales.  Chest:     Chest wall: No tenderness.  Abdominal:     General: There is no distension.     Tenderness: There is no abdominal tenderness. There is no rebound.  Musculoskeletal:        General: Normal range of motion.     Cervical back: Neck supple.  Lymphadenopathy:     Cervical: No cervical adenopathy.  Skin:    Findings: No erythema or rash.  Neurological:     Mental Status: She is alert and oriented to person, place, and time.     Motor: No abnormal muscle tone.     Coordination: Coordination normal.  Psychiatric:        Behavior: Behavior normal.     ED Results / Procedures / Treatments   Labs (all labs ordered are listed, but only abnormal results are displayed) Labs Reviewed  COMPREHENSIVE METABOLIC PANEL - Abnormal; Notable for the following components:      Result Value   Potassium 3.4 (*)    CO2 18 (*)    Glucose, Bld 122 (*)    Calcium 8.8 (*)    Total Bilirubin 1.5 (*)    All other components within normal limits  CBC - Abnormal; Notable for  the following components:   WBC 16.0 (*)    Hemoglobin 15.2 (*)    All other components within normal limits  URINALYSIS, ROUTINE W REFLEX MICROSCOPIC - Abnormal; Notable for the following components:   Color, Urine RED (*)  APPearance TURBID (*)    Glucose, UA   (*)    Value: TEST NOT REPORTED DUE TO COLOR INTERFERENCE OF URINE PIGMENT   Hgb urine dipstick   (*)    Value: TEST NOT REPORTED DUE TO COLOR INTERFERENCE OF URINE PIGMENT   Bilirubin Urine   (*)    Value: TEST NOT REPORTED DUE TO COLOR INTERFERENCE OF URINE PIGMENT   Ketones, ur   (*)    Value: TEST NOT REPORTED DUE TO COLOR INTERFERENCE OF URINE PIGMENT   Protein, ur   (*)    Value: TEST NOT REPORTED DUE TO COLOR INTERFERENCE OF URINE PIGMENT   Nitrite   (*)    Value: TEST NOT REPORTED DUE TO COLOR INTERFERENCE OF URINE PIGMENT   Leukocytes,Ua   (*)    Value: TEST NOT REPORTED DUE TO COLOR INTERFERENCE OF URINE PIGMENT   All other components within normal limits  URINALYSIS, MICROSCOPIC (REFLEX) - Abnormal; Notable for the following components:   Bacteria, UA MANY (*)    All other components within normal limits  LIPASE, BLOOD  URINALYSIS, ROUTINE W REFLEX MICROSCOPIC  I-STAT BETA HCG BLOOD, ED ([facility], [facility], [facility] ONLY)    EKG None  Radiology US Abdomen Limited RUQ  Result Date: 09/18/2019 CLINICAL DATA:  Right upper quadrant pain EXAM: ULTRASOUND ABDOMEN LIMITED RIGHT UPPER QUADRANT COMPARISON:  None. FINDINGS: Gallbladder: Cholelithiasis is noted. Gallbladder is decompressed contributing to wall thickening. No true wall thickening or pericholecystic fluid is noted. Common bile duct: Diameter: 4.5 mm. Liver: Diffuse increased echogenicity is noted consistent with fatty infiltration. Portal vein is patent on color Doppler imaging with normal direction of blood flow towards the liver. Other: None. IMPRESSION: Fatty liver. Contracted gallbladder with cholelithiasis. Electronically Signed   By: Alcide CleverMark  Lukens M.D.   On: 09/18/2019  19:10    Procedures Procedures (including critical care time)  Medications Ordered in ED Medications  fentaNYL (SUBLIMAZE) injection 50 mcg (50 mcg Intravenous Given 09/19/19 2100)  cefTRIAXone (ROCEPHIN) 1 g in sodium chloride 0.9 % 100 mL IVPB (1 g Intravenous New Bag/Given 09/20/19 0915)  sodium chloride 0.9 % bolus 500 mL (500 mLs Intravenous New Bag/Given 09/20/19 0854)    ED Course  I have reviewed the triage vital signs and the nursing notes.  Pertinent labs & imaging results that were available during my care of the patient were reviewed by me and considered in my medical decision making (see chart for details).    MDM Rules/Calculators/A&P                          Patient with persistent abdominal pain consistent with gallstones.  She will be seen by general surgery.  Repeat ultrasound will be done.  Patient's initial urinalysis showed red cells and white cells but she stated she is.  And this was a voided specimen.  We will get a Urinalysis        This patient presents to the ED for concern of abdominal pain, this involves an extensive number of treatment options, and is a complaint that carries with it a high risk of complications and morbidity.  The differential diagnosis includes gastritis cholecystitis   Lab Tests:   I Ordered, reviewed, and interpreted labs, which included CBC chemistries which showed elevated white count  Medicines ordered:   I ordered medication Rocephin for infection  Imaging Studies ordered:   I ordered imaging studies which included ultrasound abdomen and  I independently  visualized and interpreted imaging which showed pending results  Additional history obtained:   Additional history obtained from old records  Previous records obtained and reviewed.  Consultations Obtained:   I consulted general surgery and discussed lab and imaging findings  Reevaluation:  After the interventions stated above, I reevaluated the patient  and found improved  Critical Interventions:  .   Final Clinical Impression(s) / ED Diagnoses Final diagnoses:  Pain    Rx / DC Orders ED Discharge Orders    None       Bethann Berkshire, MD 09/20/19 [phone]

## 2019-09-20 NOTE — Progress Notes (Signed)
Patient arrived to [phone] via stretcher from PACU, ambulated from stretcher to bed with standby assist,  s/p lap chole w/ cholangiogram. AOx4, VSS, c/o pain 5/10 but refused pain medication. Assessed incision site,there are  4 lap sites skin glue, clean, dry and intact.   Oriented patient to room, use of call bell and placed within patient's reach. Informed patient and family at bedside about visitation policy. Will endorse accordingly.

## 2019-09-20 NOTE — ED Notes (Signed)
Will attempt to obtain urine when pt able to go.

## 2019-09-20 NOTE — Anesthesia Preprocedure Evaluation (Signed)
Anesthesia Evaluation  Patient identified by MRN, date of birth, ID band Patient awake    Reviewed: Allergy & Precautions, NPO status , Patient's Chart, lab work & pertinent test results  Airway Mallampati: II  TM Distance: >3 FB Neck ROM: Full    Dental  (+) Dental Advisory Given   Pulmonary asthma , Current Smoker,    breath sounds clear to auscultation       Cardiovascular negative cardio ROS   Rhythm:Regular Rate:Normal     Neuro/Psych negative neurological ROS     GI/Hepatic negative GI ROS, Neg liver ROS,   Endo/Other  negative endocrine ROS  Renal/GU negative Renal ROS     Musculoskeletal   Abdominal   Peds  Hematology negative hematology ROS (+)   Anesthesia Other Findings   Reproductive/Obstetrics                             Anesthesia Physical Anesthesia Plan  ASA: III  Anesthesia Plan: General   Post-op Pain Management:    Induction: Intravenous  PONV Risk Score and Plan: 2 and Dexamethasone, Ondansetron and Treatment may vary due to age or medical condition  Airway Management Planned: Oral ETT  Additional Equipment: None  Intra-op Plan:   Post-operative Plan: Extubation in OR  Informed Consent: I have reviewed the patients History and Physical, chart, labs and discussed the procedure including the risks, benefits and alternatives for the proposed anesthesia with the patient or authorized representative who has indicated his/her understanding and acceptance.     Dental advisory given  Plan Discussed with: CRNA  Anesthesia Plan Comments:         Anesthesia Quick Evaluation

## 2019-09-21 ENCOUNTER — Encounter (HOSPITAL_COMMUNITY): Payer: Self-pay | Admitting: General Surgery

## 2019-09-21 LAB — COMPREHENSIVE METABOLIC PANEL
ALT: 58 U/L — ABNORMAL HIGH (ref 0–44)
AST: 50 U/L — ABNORMAL HIGH (ref 15–41)
Albumin: 3.5 g/dL (ref 3.5–5.0)
Alkaline Phosphatase: 52 U/L (ref 38–126)
Anion gap: 9 (ref 5–15)
BUN: 7 mg/dL (ref 6–20)
CO2: 22 mmol/L (ref 22–32)
Calcium: 8.5 mg/dL — ABNORMAL LOW (ref 8.9–10.3)
Chloride: 106 mmol/L (ref 98–111)
Creatinine, Ser: 0.73 mg/dL (ref 0.44–1.00)
GFR calc Af Amer: >60 mL/min (ref >60)
GFR calc non Af Amer: >60 mL/min (ref >60)
Glucose, Bld: 120 mg/dL — ABNORMAL HIGH (ref 70–99)
Potassium: 3.3 mmol/L — ABNORMAL LOW (ref 3.5–5.1)
Sodium: 137 mmol/L (ref 135–145)
Total Bilirubin: 1.6 mg/dL — ABNORMAL HIGH (ref 0.3–1.2)
Total Protein: 6.9 g/dL (ref 6.5–8.1)

## 2019-09-21 LAB — SURGICAL PATHOLOGY

## 2019-09-21 LAB — HIV ANTIBODY (ROUTINE TESTING W REFLEX): HIV Screen 4th Generation wRfx: NONREACTIVE

## 2019-09-21 MED ORDER — POLYETHYLENE GLYCOL 3350 17 G PO PACK: 17.0000 g | PACK | Freq: Every day | ORAL | 0 refills | Status: DC | PRN

## 2019-09-21 MED ORDER — OXYCODONE HCL 5 MG PO TABS: 5.0000 mg | ORAL_TABLET | Freq: Four times a day (QID) | ORAL | 0 refills | Status: DC | PRN

## 2019-09-21 NOTE — Progress Notes (Signed)
Patient discharged to home. Verbalizes understanding of all discharge instructions including incision care, discharge medications, and follow up MD visits.  

## 2019-09-21 NOTE — Discharge Summary (Signed)
[facility] Discharge Summary   Patient ID: Hailey Bowman MRN: [id number] DOB/AGE: 49-12-1970 49 y.o.  Admit date: 09/19/2019 Discharge date: 09/21/2019  Admitting Diagnosis: Symptomatic cholelithiasis, suspect acute cholecystitis   Discharge Diagnosis cholecystitis  Consultants None  Imaging: DG Cholangiogram Operative  Result Date: 09/20/2019 CLINICAL DATA:  Cholecystectomy for cholelithiasis and cholecystitis. EXAM: INTRAOPERATIVE CHOLANGIOGRAM TECHNIQUE: Cholangiographic images from the C-arm fluoroscopic device were submitted for interpretation post-operatively. Please see the procedural report for the amount of contrast and the fluoroscopy time utilized. COMPARISON:  Abdominal ultrasound studies on 09/18/2019 and 09/20/2019 FINDINGS: Intraoperative imaging of a with a C-arm demonstrates no evidence of biliary filling defects or obstruction. Contrast is seen in the duodenum. The common bile duct does not appear overtly dilated. IMPRESSION: Unremarkable intraoperative cholangiogram. Electronically Signed   By: Irish Lack M.D.   On: 09/20/2019 16:15   US Abdomen Limited RUQ  Result Date: 09/20/2019 CLINICAL DATA:  Abdominal pain since Saturday. EXAM: ULTRASOUND ABDOMEN LIMITED RIGHT UPPER QUADRANT COMPARISON:  Abdominal ultrasound 09/18/2019 FINDINGS: Gallbladder: There are stones measuring up to 1.3 cm. No gallbladder wall thickening. No sonographic Murphy sign noted by sonographer. Common bile duct: Diameter: 0.8 cm.  No intraductal calculus visualized. Liver: No focal lesion identified. Mild diffuse increase in liver parenchymal echogenicity. Portal vein is patent on color Doppler imaging with normal direction of blood flow towards the liver. Other: None. IMPRESSION: 1. The common bile duct is mildly dilated measuring 0.8 cm. This is a nonspecific finding. Recommend correlation with laboratory values and if there is concern for biliary obstruction, consider liver  MRI. 2.  Cholelithiasis without evidence of cholecystitis. 3. Mildly increased liver parenchymal echogenicity most commonly seen with hepatic steatosis. Electronically Signed   By: Emmaline Kluver M.D.   On: 09/20/2019 11:16    Procedures Dr. Janee Morn (09/20/2019) - Laparoscopic Cholecystectomy with [facility]  Hospital Course:  Hailey Bowman is a 49yo female who presented to [facility] 7/8 with 5 days of worsening upper abdominal pain, nausea, and vomiting.  Workup showed revealed symptomatic cholelithiasis with concern for early acute cholecystitis.  Patient was admitted and underwent procedure listed above.  IOC negative for filling defect of the common bile duct. Tolerated procedure well and was transferred to the floor.  Diet was advanced as tolerated.  Total bilirubin 1.6 from 1.5 from POD#1. On POD1, the patient was voiding well, tolerating diet, ambulating well, pain well controlled, vital signs stable, incisions c/d/i and felt stable for discharge home.  Patient will follow up as below and knows to call with questions or concerns.  She will have a CMP prior to her follow up appointment.  I have personally reviewed the patients medication history on the [place] controlled substance database.    Physical Exam: General:  Alert, NAD, pleasant, comfortable Pulm: rate and effort normal Abd:  Soft, ND, appropriately tender, multiple lap incisions C/D/I  Allergies as of 09/21/2019      Reactions   Ibuprofen Hives   Penicillins Other (See Comments)   Childhood unk       Medication List    STOP taking these medications   famotidine 20 MG tablet Commonly known as: PEPCID   ondansetron 4 MG disintegrating tablet Commonly known as: Zofran ODT   pantoprazole 20 MG tablet Commonly known as: PROTONIX   promethazine 25 MG tablet Commonly known as: PHENERGAN     TAKE these medications   acetaminophen 500 MG tablet Commonly known as: TYLENOL Take 500-1,000 mg by mouth daily as  needed for mild  pain or headache.   albuterol 108 (90 Base) MCG/ACT inhaler Commonly known as: ProAir HFA Inhale 2 puffs into the lungs every 6 (six) hours as needed for wheezing or shortness of breath.   buPROPion 100 MG tablet Commonly known as: Wellbutrin Take 1 tablet (100 mg total) by mouth 2 (two) times daily. What changed:   how much to take  when to take this   oxyCODONE 5 MG immediate release tablet Commonly known as: Oxy IR/ROXICODONE Take 1 tablet (5 mg total) by mouth every 6 (six) hours as needed for severe pain.   polyethylene glycol 17 g packet Commonly known as: MIRALAX / GLYCOLAX Take 17 g by mouth daily as needed for mild constipation.   potassium chloride SA 20 MEQ tablet Commonly known as: KLOR-CON Take 1 tablet (20 mEq total) by mouth 2 (two) times daily for 3 days.   Zoloft 50 MG tablet Generic drug: sertraline Take 150 mg by mouth daily.         Follow-up Information    [facility], [place]. Go on 10/11/2019.   Specialty: General Surgery Why: Your appointment is 07/29 at 2:30 pm Please arrive 30 minutes prior to your appointment to check in and fill out paperwork. Bring photo ID and insurance information. Contact information: [address] [place] [address] [phone]       Wanda Plump, MD. Call.   Specialty: Internal Medicine Why: monitor blood pressure at home and follow up with PCP to discuss Contact information: [address] [place] [address] [phone]        [facility] Follow up.   Why: You need to have lab work prior to your appointment. Please go on 7/26. you do not have to have an appointment. arrive any time 8am to 3pm Contact information: [address] [place], [place]              Signed: Franne Forts, PA-C [facility] 09/21/2019, 11:05 AM Please see Amion for pager number during day hours 7:00am-4:30pm

## 2019-09-29 ENCOUNTER — Emergency Department (HOSPITAL_BASED_OUTPATIENT_CLINIC_OR_DEPARTMENT_OTHER)
Admission: EM | Admit: 2019-09-29 | Discharge: 2019-09-29 | Disposition: A | Discharge status: Home / Self Care | Payer: BC Managed Care – PPO | Attending: Emergency Medicine | Admitting: Emergency Medicine

## 2019-09-29 ENCOUNTER — Emergency Department (HOSPITAL_BASED_OUTPATIENT_CLINIC_OR_DEPARTMENT_OTHER): Payer: BC Managed Care – PPO

## 2019-09-29 ENCOUNTER — Other Ambulatory Visit: Payer: Self-pay

## 2019-09-29 ENCOUNTER — Encounter (HOSPITAL_BASED_OUTPATIENT_CLINIC_OR_DEPARTMENT_OTHER): Payer: Self-pay

## 2019-09-29 DIAGNOSIS — G8918 Other acute postprocedural pain: Secondary | ICD-10-CM | POA: Diagnosis not present

## 2019-09-29 DIAGNOSIS — F17210 Nicotine dependence, cigarettes, uncomplicated: Secondary | ICD-10-CM | POA: Insufficient documentation

## 2019-09-29 DIAGNOSIS — Z9889 Other specified postprocedural states: Secondary | ICD-10-CM

## 2019-09-29 DIAGNOSIS — F1210 Cannabis abuse, uncomplicated: Secondary | ICD-10-CM | POA: Insufficient documentation

## 2019-09-29 DIAGNOSIS — R1011 Right upper quadrant pain: Secondary | ICD-10-CM | POA: Insufficient documentation

## 2019-09-29 DIAGNOSIS — J45909 Unspecified asthma, uncomplicated: Secondary | ICD-10-CM | POA: Diagnosis not present

## 2019-09-29 DIAGNOSIS — N281 Cyst of kidney, acquired: Secondary | ICD-10-CM | POA: Diagnosis not present

## 2019-09-29 DIAGNOSIS — R918 Other nonspecific abnormal finding of lung field: Secondary | ICD-10-CM | POA: Diagnosis not present

## 2019-09-29 DIAGNOSIS — Z79899 Other long term (current) drug therapy: Secondary | ICD-10-CM | POA: Insufficient documentation

## 2019-09-29 DIAGNOSIS — R1013 Epigastric pain: Secondary | ICD-10-CM | POA: Diagnosis not present

## 2019-09-29 LAB — URINALYSIS, ROUTINE W REFLEX MICROSCOPIC
Bilirubin Urine: NEGATIVE
Glucose, UA: NEGATIVE mg/dL
Ketones, ur: NEGATIVE mg/dL
Leukocytes,Ua: NEGATIVE
Nitrite: NEGATIVE
Protein, ur: NEGATIVE mg/dL
Specific Gravity, Urine: 1.02 (ref 1.005–1.030)
pH: >9 — ABNORMAL HIGH (ref 5.0–8.0)

## 2019-09-29 LAB — CBC WITH DIFFERENTIAL/PLATELET
Abs Immature Granulocytes: 0.08 10*3/uL — ABNORMAL HIGH (ref 0.00–0.07)
Basophils Absolute: 0.1 10*3/uL (ref 0.0–0.1)
Basophils Relative: 0 %
Eosinophils Absolute: 0.1 10*3/uL (ref 0.0–0.5)
Eosinophils Relative: 1 %
HCT: 42.2 % (ref 36.0–46.0)
Hemoglobin: 14.3 g/dL (ref 12.0–15.0)
Immature Granulocytes: 1 %
Lymphocytes Relative: 11 %
Lymphs Abs: 1.7 10*3/uL (ref 0.7–4.0)
MCH: 29.8 pg (ref 26.0–34.0)
MCHC: 33.9 g/dL (ref 30.0–36.0)
MCV: 87.9 fL (ref 80.0–100.0)
Monocytes Absolute: 0.8 10*3/uL (ref 0.1–1.0)
Monocytes Relative: 5 %
Neutro Abs: 12.4 10*3/uL — ABNORMAL HIGH (ref 1.7–7.7)
Neutrophils Relative %: 82 %
Platelets: 412 10*3/uL — ABNORMAL HIGH (ref 150–400)
RBC: 4.8 MIL/uL (ref 3.87–5.11)
RDW: 13.6 % (ref 11.5–15.5)
WBC: 15.1 10*3/uL — ABNORMAL HIGH (ref 4.0–10.5)
nRBC: 0 % (ref 0.0–0.2)

## 2019-09-29 LAB — COMPREHENSIVE METABOLIC PANEL
ALT: 33 U/L (ref 0–44)
AST: 24 U/L (ref 15–41)
Albumin: 3.9 g/dL (ref 3.5–5.0)
Alkaline Phosphatase: 76 U/L (ref 38–126)
Anion gap: 14 (ref 5–15)
BUN: 9 mg/dL (ref 6–20)
CO2: 21 mmol/L — ABNORMAL LOW (ref 22–32)
Calcium: 8.9 mg/dL (ref 8.9–10.3)
Chloride: 103 mmol/L (ref 98–111)
Creatinine, Ser: 0.74 mg/dL (ref 0.44–1.00)
GFR calc Af Amer: >60 mL/min (ref >60)
GFR calc non Af Amer: >60 mL/min (ref >60)
Glucose, Bld: 144 mg/dL — ABNORMAL HIGH (ref 70–99)
Potassium: 3.9 mmol/L (ref 3.5–5.1)
Sodium: 138 mmol/L (ref 135–145)
Total Bilirubin: 0.5 mg/dL (ref 0.3–1.2)
Total Protein: 8 g/dL (ref 6.5–8.1)

## 2019-09-29 LAB — URINALYSIS, MICROSCOPIC (REFLEX): WBC, UA: NONE SEEN WBC/hpf (ref 0–5)

## 2019-09-29 LAB — LACTIC ACID, PLASMA: Lactic Acid, Venous: 1.8 mmol/L (ref 0.5–1.9)

## 2019-09-29 LAB — LIPASE, BLOOD: Lipase: 24 U/L (ref 11–51)

## 2019-09-29 IMAGING — DX DG CHEST 1V PORT
1 series · 1 of 1 positions shown · non-contrast
Comparison: None.

CLINICAL DATA: Right upper quadrant abdominal pain radiating to the
back

EXAM:
PORTABLE CHEST 1 VIEW

[chest ap]
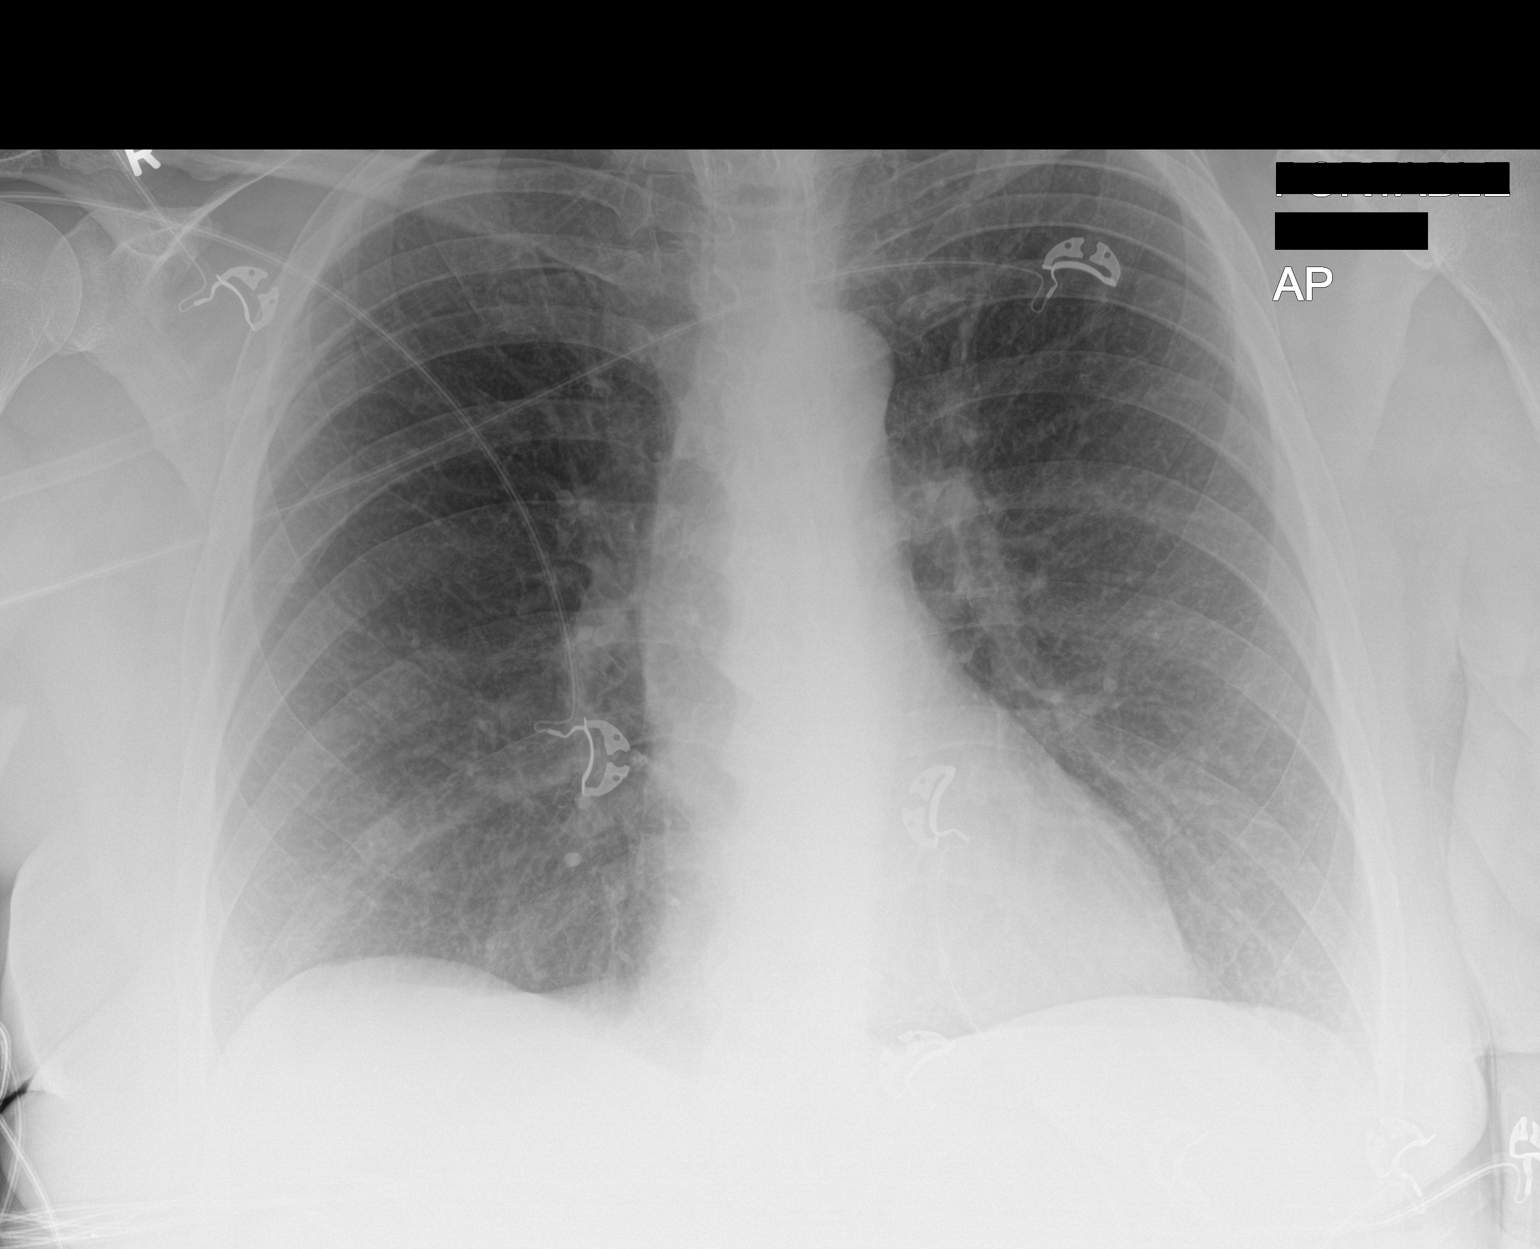

[1 of 1 positions shown; findings below may reference images not displayed]

FINDINGS: Hyperinflation. Some coarsened interstitial and bronchitic features.
No consolidation, features of edema, pneumothorax, or effusion. The
cardiomediastinal contours are unremarkable. No acute osseous or
soft tissue abnormality. Degenerative changes are present in the
imaged spine and shoulders. Telemetry leads overlie the chest.
IMPRESSION: Hyperinflation with coarsened interstitial changes and bronchitic
features, could reflect smoking related change.

No other acute cardiopulmonary abnormality.

Aortic Atherosclerosis ([0F]-[0F]).

## 2019-09-29 IMAGING — CT CT ABD-PELV W/ CM
2 of 5 series · 17 of 46 positions shown, 19 images · IV contrast (Omnipaque)
Comparison: None.

CLINICAL DATA: Right upper quadrant pain, recent cholecystectomy

EXAM:
CT ABDOMEN AND PELVIS WITH CONTRAST
TECHNIQUE: Multidetector CT imaging of the abdomen and pelvis was performed
using the standard protocol following bolus administration of
intravenous contrast.
CONTRAST:  100mL OMNIPAQUE IOHEXOL 300 MG/ML  SOLN

[Series 2: axial st · axial · 0.98mm/px · z∈[-476,-66]mm · 14 of 94 slices shown, 16 images]
[im 6/94  soft-tissue]
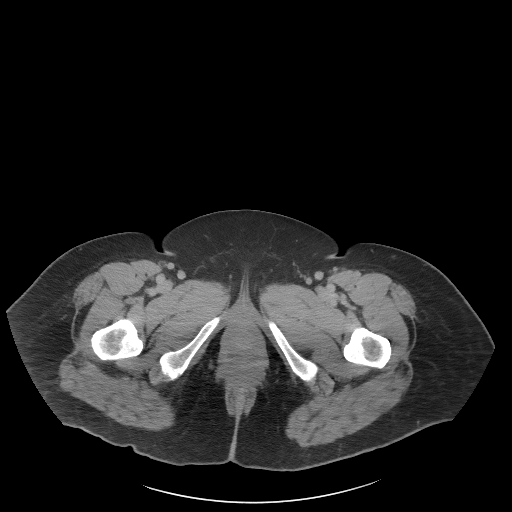
[im 6/94  bone]
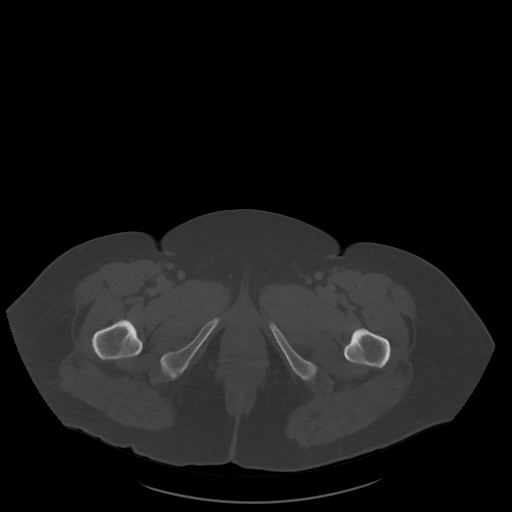
[im 11/94  soft-tissue]
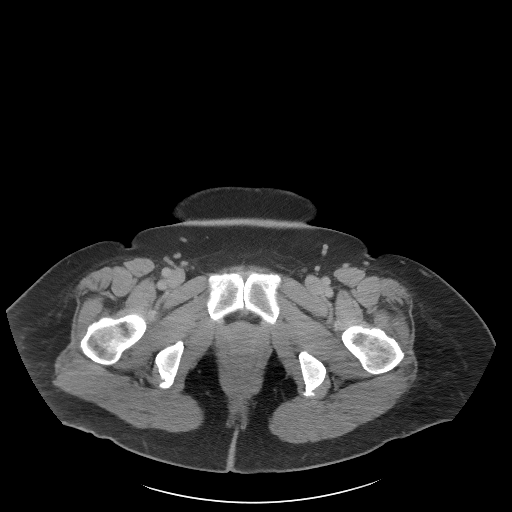
[im 21/94  soft-tissue]
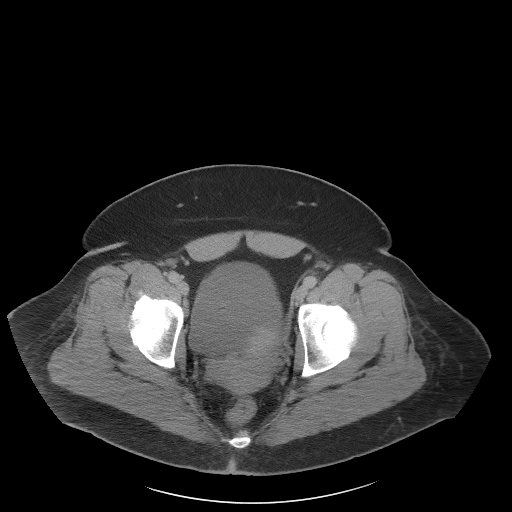
[im 26/94  soft-tissue]
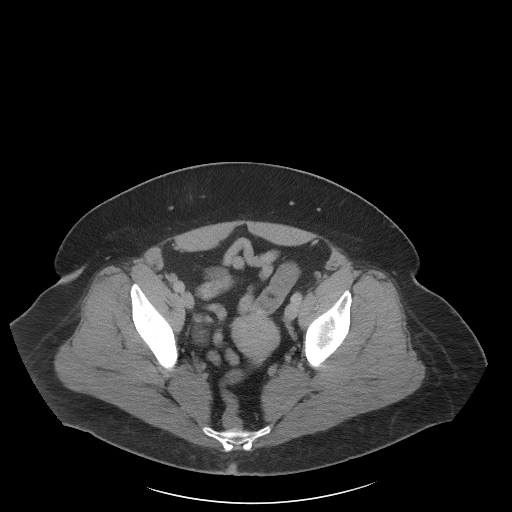
[im 32/94  soft-tissue]
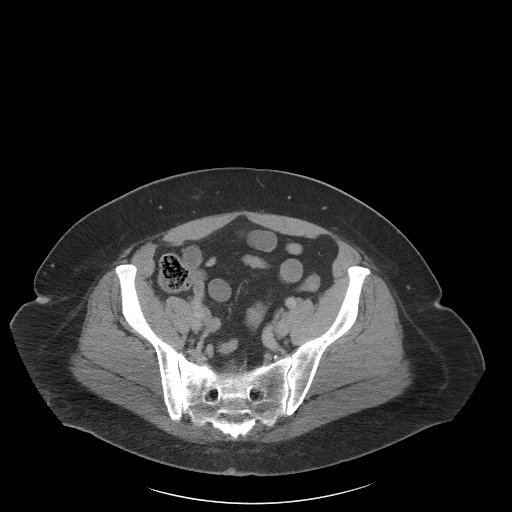
[im 37/94  soft-tissue]
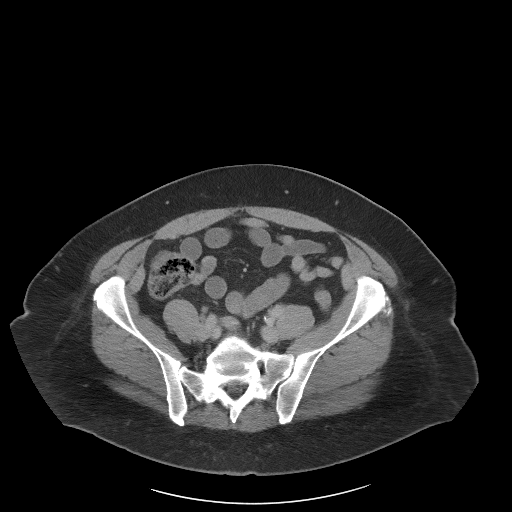
[im 42/94  soft-tissue]
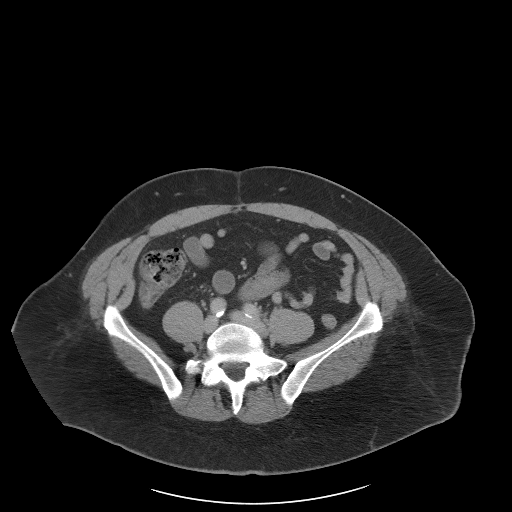
[im 52/94  soft-tissue]
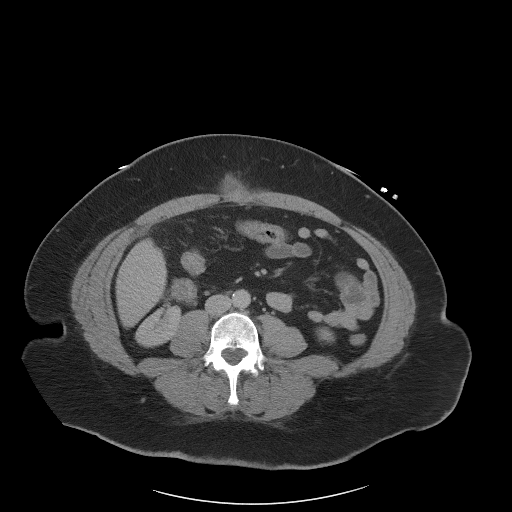
[im 57/94  soft-tissue]
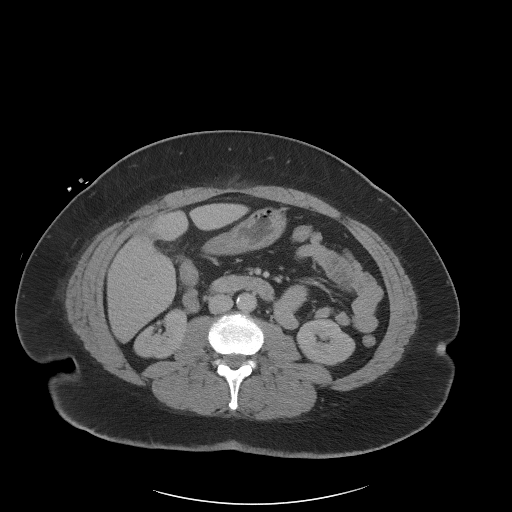
[im 57/94  bone]
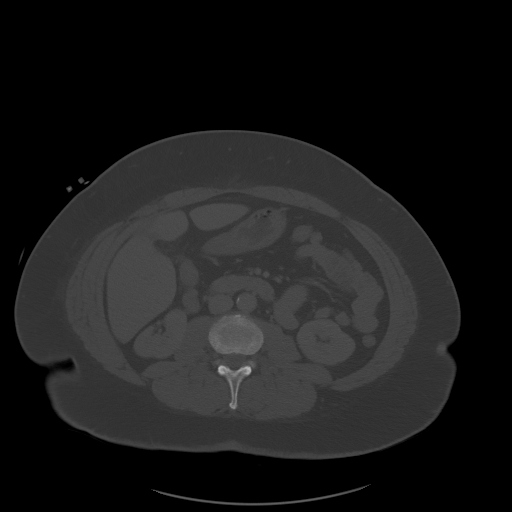
[im 63/94  soft-tissue]
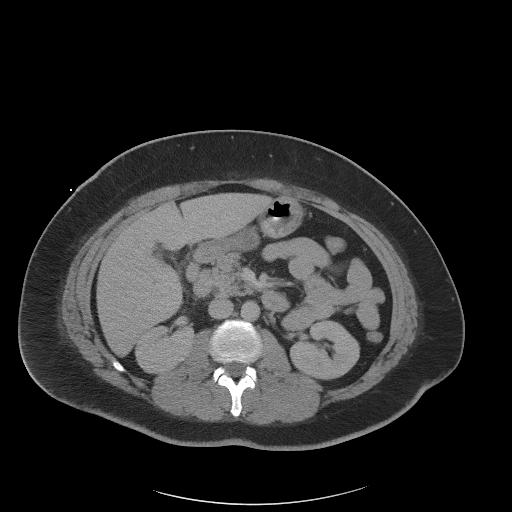
[im 68/94  soft-tissue]
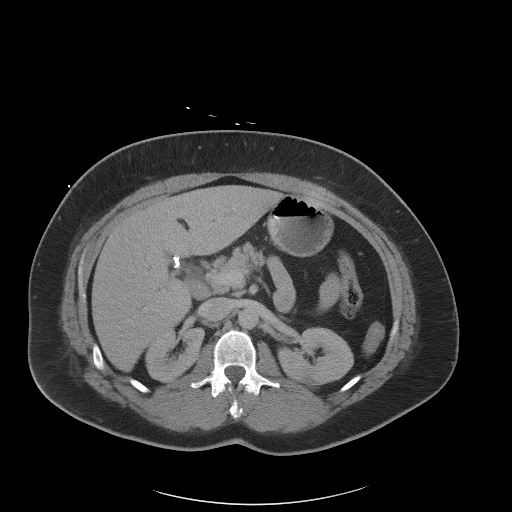
[im 73/94  soft-tissue]
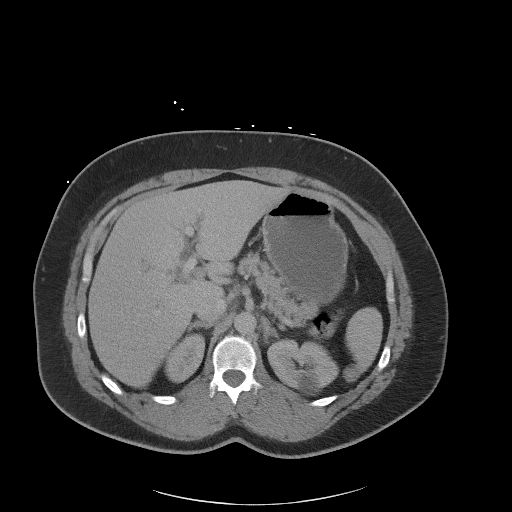
[im 83/94  soft-tissue]
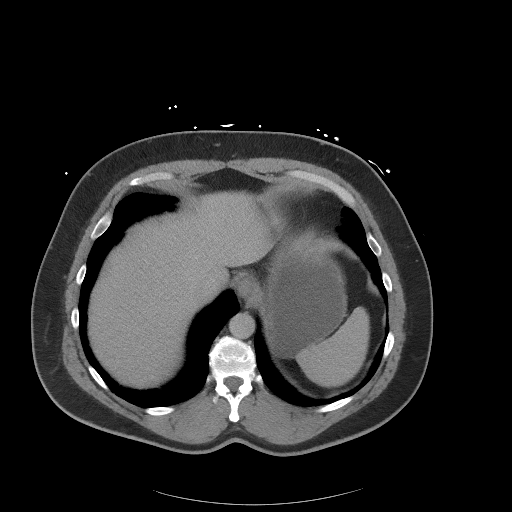
[im 88/94  soft-tissue]
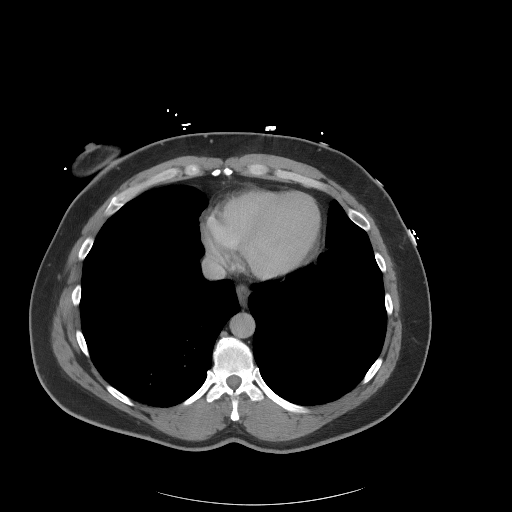

[Series 5: coronal st · coronal · 0.87mm/px · 3 of 101 slices shown]
[im 34/101  soft-tissue]
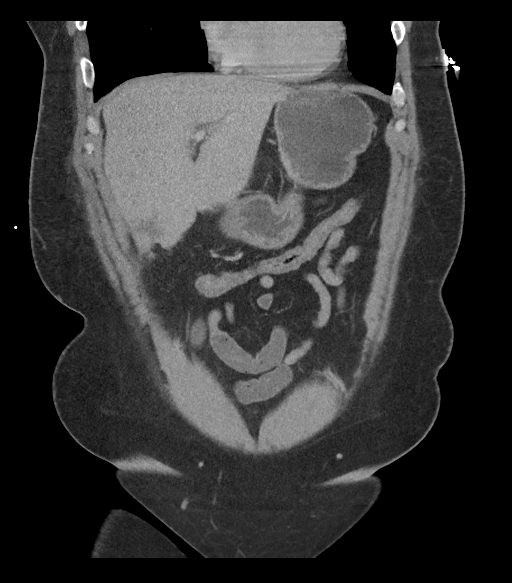
[im 45/101  soft-tissue]
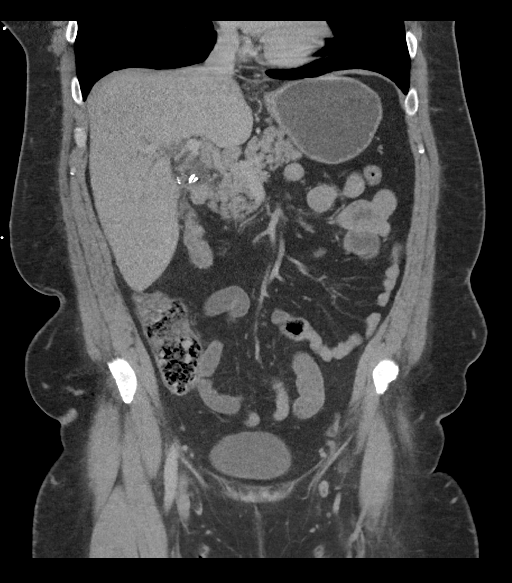
[im 56/101  soft-tissue]
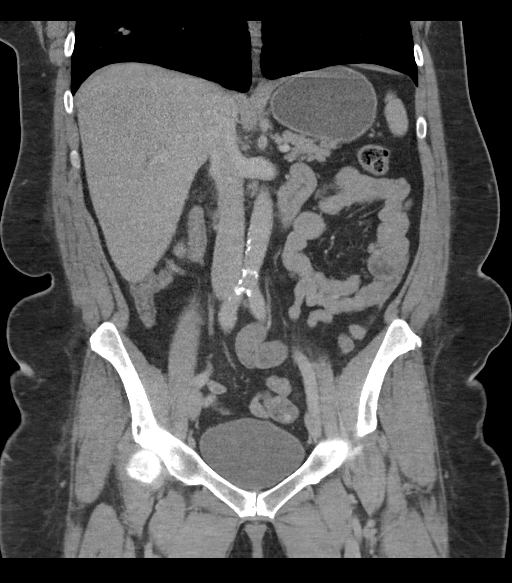

[17 of 46 positions shown; findings below may reference images not displayed]

FINDINGS: Lower chest: No acute abnormality.

Hepatobiliary: Post cholecystectomy. There is small volume fluid in
the cholecystectomy bed. Common bile duct tapers normally. There is
expected mild intrahepatic duct dilatation. No focal hepatic lesion.

Pancreas: Unremarkable.

Spleen: Unremarkable.

Adrenals/Urinary Tract: Adrenals are unremarkable. Small cyst of the
upper pole of the left kidney. Kidneys are otherwise unremarkable.
The bladder is unremarkable.

Stomach/Bowel: Stomach is within normal limits. Bowel is normal in
caliber. No significant stool burden. Normal appendix.

Vascular/Lymphatic: Aortic atherosclerosis. Mildly enlarged lymph
nodes at the porta hepatis are probably reactive.

Reproductive: Uterus and bilateral adnexa are unremarkable.

Other: Postoperative changes in the abdominal wall.  No free air.

Musculoskeletal: No acute osseous abnormality.
IMPRESSION: Post cholecystectomy. Nonspecific small volume fluid within the
cholecystectomy bed.

Likely reactive mildly large nodes at the porta hepatis.

## 2019-09-29 MED ORDER — ONDANSETRON HCL 4 MG/2ML IJ SOLN
4.0000 mg | Freq: Once | INTRAMUSCULAR | Status: AC
  Administered 2019-09-29: 4 mg via INTRAVENOUS
  Filled 2019-09-29: qty 2

## 2019-09-29 MED ORDER — HYDROMORPHONE HCL 1 MG/ML IJ SOLN
0.5000 mg | Freq: Once | INTRAMUSCULAR | Status: AC
  Administered 2019-09-29: 0.5 mg via INTRAVENOUS
  Filled 2019-09-29: qty 1

## 2019-09-29 MED ORDER — HYDROMORPHONE HCL 1 MG/ML IJ SOLN
1.0000 mg | Freq: Once | INTRAMUSCULAR | Status: AC
  Administered 2019-09-29: 1 mg via INTRAVENOUS
  Filled 2019-09-29: qty 1

## 2019-09-29 MED ORDER — IOHEXOL 300 MG/ML  SOLN
100.0000 mL | Freq: Once | INTRAMUSCULAR | Status: AC | PRN
  Administered 2019-09-29: 100 mL via INTRAVENOUS

## 2019-09-29 MED ORDER — LACTATED RINGERS IV BOLUS (SEPSIS)
1000.0000 mL | Freq: Once | INTRAVENOUS | Status: AC
  Administered 2019-09-29: 1000 mL via INTRAVENOUS

## 2019-09-29 NOTE — ED Provider Notes (Signed)
Care assumed from Lyndel Safe, PA-C at shift change pending p.o. challenge.  See her note for full HPI.  In short, patient 49 year old female with past medical history significant for anxiety, depression who presents to the ED due to abdominal pain that started around 10 AM this morning.  Pain located in the epigastric region and right upper quadrant.  Patient recently had a laparoscopic cholecystectomy by Dr. Janee Morn on 09/20/2019.  Patient denies fever, chills.  Denies urinary symptoms.  All labs reviewed from previous provider.  CBC significant for leukocytosis at 15.1 which appears to be chronically elevated.  Lipase normal at 24.  Doubt pancreatitis.  Lactic acid normal at 1.8.  UA significant for trace hematuria, but no signs of infection.  CMP significant for hyperglycemia at 144 with no anion gap.  Normal renal function.  No major electrolyte derangements.  CT abdomen personally reviewed which demonstrates: IMPRESSION:  Post cholecystectomy. Nonspecific small volume fluid within the  cholecystectomy bed.    Likely reactive mildly large nodes at the porta hepatis.   CXR personally reviewed which demonstrates: IMPRESSION:  Hyperinflation with coarsened interstitial changes and bronchitic  features, could reflect smoking related change.    No other acute cardiopulmonary abnormality.    Aortic Atherosclerosis (ICD10-I70.0).   9:13 PM reassessed patient at bedside who notes improvement in symptoms.  Patient able to tolerate p.o. without difficulty.  Abdomen soft, nondistended, nontender.  Well-healing incision sites.  Patient notes she is ready to go home.  Instructed patient to call Dr. Carollee Massed office on Monday to schedule point for further evaluation.  Previous provider filled out disposition paperwork. Strict ED precautions discussed with patient. Patient states understanding and agrees to plan. Patient discharged home in no acute distress and stable vitals.    Jesusita Oka 09/29/19 2117    Gwyneth Sprout, MD 09/30/19 [phone]

## 2019-09-29 NOTE — ED Triage Notes (Signed)
Pt c/o RUQ abd pain with radiation to the back. Pt had her gallbladder removed last week.

## 2019-09-29 NOTE — ED Notes (Signed)
Patient is toileting.

## 2019-09-29 NOTE — ED Notes (Signed)
PO challenge passed 

## 2019-09-29 NOTE — Discharge Instructions (Addendum)
Please take Ibuprofen (Advil, motrin) and Tylenol (acetaminophen) to relieve your pain.  You may take up to 600 MG (3 pills) of normal strength ibuprofen every 8 hours as needed.  In between doses of ibuprofen you make take tylenol, up to 1,000 mg (two extra strength pills).  Do not take more than 3,000 mg tylenol in a 24 hour period.  Please check all medication labels as many medications such as pain and cold medications may contain tylenol.  Do not drink alcohol while taking these medications.  Do not take other NSAID'S while taking ibuprofen (such as aleve or naproxen).  Please take ibuprofen with food to decrease stomach upset. ° °Today you received medications that may make you sleepy or impair your ability to make decisions.  For the next 24 hours please do not drive, operate heavy machinery, care for a small child with out another adult present, or perform any activities that may cause harm to you or someone else if you were to fall asleep or be impaired.  ° ° °

## 2019-09-29 NOTE — ED Notes (Signed)
EDP verbalized discontinuation of second lactic acid

## 2019-09-29 NOTE — ED Provider Notes (Signed)
EMERGENCY DEPARTMENT Provider Note   CSN: [id number] Arrival date & time: 09/29/19  1508     History Chief Complaint  Patient presents with  . Abdominal Pain    Hailey Bowman is a 49 y.o. female with past medical history of anxiety, depression, who presents today for evaluation of abdominal pain.  She reports that at about 10 this morning she was having a bowel movement when she had sudden onset of severe epigastric to right upper quadrant pain radiating into her back.  She states that this is in the same location as her gallbladder related pain wise.  She had a lap chole with Dr. Grandville Silos on 09/20/2019.  Husband reports that they called them prior to coming here today.  Patient states that her pain and symptoms have been doing much better since her surgery, she has been afebrile and improving until this started today.  She denies any known fevers at home.  She denies dysuria increased frequency or urgency.  She took Phenergan prior to arrival.  She took oxycodone without relief of her symptoms.  Chart review shows that she was getting 17m of dilaudid for her pain while admitted.  Additionally chart review shows that she did not have CT preoperatively, only UKorealimited to RUQ.     HPI     Past Medical History:  Diagnosis Date  . Anxiety   . Asthma   . Contraceptive use    condoms  . Depression   . History of UTI    as a child     Patient Active Problem List   Diagnosis Date Noted  . Acute cholecystitis 09/20/2019  . Allergic rhinitis 08/23/2013  . Otitis media 03/09/2012  . Tobacco abuse 03/09/2012  . Asthma   . ANXIETY 11/30/2006    Past Surgical History:  Procedure Laterality Date  . ANKLE SURGERY Left   . BLADDER SURGERY     as a child x 2  . CHOLECYSTECTOMY N/A 09/20/2019   Procedure: LAPAROSCOPIC CHOLECYSTECTOMY WITH INTRAOPERATIVE CHOLANGIOGRAM;  Surgeon: TGeorganna Skeans MD;  Location: [facility]  Service: General;  Laterality: N/A;  .  DILATION AND CURETTAGE OF UTERUS    . WISDOM TOOTH EXTRACTION       OB History   No obstetric history on file.     Family History  Problem Relation Age of Onset  . Breast cancer Mother   . Diabetes Other        GF?  . CAD Other        GM CHF,CAD  . Colon cancer Neg Hx     Social History   Tobacco Use  . Smoking status: Current Every Day Smoker    Types: Cigarettes  . Smokeless tobacco: Never Used  . Tobacco comment: 1 ppd  Vaping Use  . Vaping Use: Never used  Substance Use Topics  . Alcohol use: Yes    Comment: occ  . Drug use: Yes    Types: Marijuana    Home Medications Prior to Admission medications   Medication Sig Start Date End Date Taking? Authorizing Provider  acetaminophen (TYLENOL) 500 MG tablet Take 500-1,000 mg by mouth daily as needed for mild pain or headache.    [provider]  albuterol (PROAIR HFA) 108 (90 BASE) MCG/ACT inhaler Inhale 2 puffs into the lungs every 6 (six) hours as needed for wheezing or shortness of breath. 01/10/15   PColon Branch MD  buPROPion (WELLBUTRIN) 100 MG tablet Take 1 tablet (  100 mg total) by mouth 2 (two) times daily. Patient taking differently: Take 300 mg by mouth daily.  07/16/14   Colon Branch, MD  oxyCODONE (OXY IR/ROXICODONE) 5 MG immediate release tablet Take 1 tablet (5 mg total) by mouth every 6 (six) hours as needed for severe pain. 09/21/19   Meuth, Brooke A, PA-C  polyethylene glycol (MIRALAX / GLYCOLAX) 17 g packet Take 17 g by mouth daily as needed for mild constipation. 09/21/19   Meuth, Brooke A, PA-C  potassium chloride SA (KLOR-CON) 20 MEQ tablet Take 1 tablet (20 mEq total) by mouth 2 (two) times daily for 3 days. 09/18/19 09/21/19  Maudie Flakes, MD  sertraline (ZOLOFT) 50 MG tablet Take 150 mg by mouth daily.    [provider]    Allergies    Ibuprofen and Penicillins  Review of Systems   Review of Systems  Constitutional: Negative for chills and fever.  HENT: Negative for congestion.     Eyes: Negative for visual disturbance.  Respiratory: Negative for cough and shortness of breath.   Cardiovascular: Negative for chest pain.  Gastrointestinal: Positive for abdominal pain and nausea. Negative for constipation, diarrhea and vomiting.  Genitourinary: Negative for dysuria, frequency and urgency.  Musculoskeletal: Positive for back pain. Negative for neck pain.  Skin: Negative for color change, rash and wound.  Neurological: Negative for weakness and headaches.  Psychiatric/Behavioral: Negative for confusion.  All other systems reviewed and are negative.   Physical Exam Updated Vital Signs BP 133/82 (BP Location: Left Arm)   Pulse 89   Temp 98.1 F (36.7 C) (Oral)   Resp 16   Ht 5' 9"  (1.753 m)   Wt 113.4 kg   LMP 09/17/2019 (Exact Date)   SpO2 98%   BMI 36.92 kg/m   Physical Exam Vitals and nursing note reviewed.  Constitutional:      Appearance: She is well-developed.     Comments: Appears uncomfortable, tearful  HENT:     Head: Normocephalic and atraumatic.  Eyes:     Conjunctiva/sclera: Conjunctivae normal.  Cardiovascular:     Rate and Rhythm: Normal rate and regular rhythm.     Heart sounds: No murmur heard.   Pulmonary:     Effort: Pulmonary effort is normal. No respiratory distress.     Breath sounds: Normal breath sounds.  Abdominal:     General: Bowel sounds are decreased. There is no distension.     Palpations: Abdomen is soft.     Tenderness: There is abdominal tenderness (Generalized, worse in epigastric, RUQ and LUQ. ). There is guarding. There is no right CVA tenderness or left CVA tenderness.     Hernia: No hernia is present.     Comments: Surgical incisions are CDI with out abnormal erythema, edema or induration.   Musculoskeletal:     Cervical back: Neck supple.  Skin:    General: Skin is warm and dry.  Neurological:     General: No focal deficit present.     Mental Status: She is alert.     Cranial Nerves: No cranial nerve  deficit.  Psychiatric:        Mood and Affect: Mood normal.        Behavior: Behavior normal.     ED Results / Procedures / Treatments   Labs (all labs ordered are listed, but only abnormal results are displayed) Labs Reviewed  COMPREHENSIVE METABOLIC PANEL - Abnormal; Notable for the following components:      Result Value  CO2 21 (*)    Glucose, Bld 144 (*)    All other components within normal limits  CBC WITH DIFFERENTIAL/PLATELET - Abnormal; Notable for the following components:   WBC 15.1 (*)    Platelets 412 (*)    Neutro Abs 12.4 (*)    Abs Immature Granulocytes 0.08 (*)    All other components within normal limits  URINALYSIS, ROUTINE W REFLEX MICROSCOPIC - Abnormal; Notable for the following components:   APPearance CLOUDY (*)    pH >9.0 (*)    Hgb urine dipstick TRACE (*)    All other components within normal limits  URINALYSIS, MICROSCOPIC (REFLEX) - Abnormal; Notable for the following components:   Bacteria, UA RARE (*)    All other components within normal limits  LIPASE, BLOOD  LACTIC ACID, PLASMA    EKG None  Radiology CT ABDOMEN PELVIS W CONTRAST  Result Date: 09/29/2019 CLINICAL DATA:  Right upper quadrant pain, recent cholecystectomy EXAM: CT ABDOMEN AND PELVIS WITH CONTRAST TECHNIQUE: Multidetector CT imaging of the abdomen and pelvis was performed using the standard protocol following bolus administration of intravenous contrast. CONTRAST:  159m OMNIPAQUE IOHEXOL 300 MG/ML  SOLN COMPARISON:  None. FINDINGS: Lower chest: No acute abnormality. Hepatobiliary: Post cholecystectomy. There is small volume fluid in the cholecystectomy bed. Common bile duct tapers normally. There is expected mild intrahepatic duct dilatation. No focal hepatic lesion. Pancreas: Unremarkable. Spleen: Unremarkable. Adrenals/Urinary Tract: Adrenals are unremarkable. Small cyst of the upper pole of the left kidney. Kidneys are otherwise unremarkable. The bladder is unremarkable.  Stomach/Bowel: Stomach is within normal limits. Bowel is normal in caliber. No significant stool burden. Normal appendix. Vascular/Lymphatic: Aortic atherosclerosis. Mildly enlarged lymph nodes at the porta hepatis are probably reactive. Reproductive: Uterus and bilateral adnexa are unremarkable. Other: Postoperative changes in the abdominal wall.  No free air. Musculoskeletal: No acute osseous abnormality. IMPRESSION: Post cholecystectomy. Nonspecific small volume fluid within the cholecystectomy bed. Likely reactive mildly large nodes at the porta hepatis. Electronically Signed   By: PMacy MisM.D.   On: 09/29/2019 18:17   DG Chest Port 1 View  Result Date: 09/29/2019 CLINICAL DATA:  Right upper quadrant abdominal pain radiating to the back EXAM: PORTABLE CHEST 1 VIEW COMPARISON:  None. FINDINGS: Hyperinflation. Some coarsened interstitial and bronchitic features. No consolidation, features of edema, pneumothorax, or effusion. The cardiomediastinal contours are unremarkable. No acute osseous or soft tissue abnormality. Degenerative changes are present in the imaged spine and shoulders. Telemetry leads overlie the chest. IMPRESSION: Hyperinflation with coarsened interstitial changes and bronchitic features, could reflect smoking related change. No other acute cardiopulmonary abnormality. Aortic Atherosclerosis (ICD10-I70.0). Electronically Signed   By: PLovena LeM.D.   On: 09/29/2019 18:12    Procedures Procedures (including critical care time)  Medications Ordered in ED Medications  lactated ringers bolus 1,000 mL (0 mLs Intravenous Stopped 09/29/19 1723)  HYDROmorphone (DILAUDID) injection 0.5 mg (0.5 mg Intravenous Given 09/29/19 1601)  ondansetron (ZOFRAN) injection 4 mg (4 mg Intravenous Given 09/29/19 1601)  HYDROmorphone (DILAUDID) injection 1 mg (1 mg Intravenous Given 09/29/19 1709)  iohexol (OMNIPAQUE) 300 MG/ML solution 100 mL (100 mLs Intravenous Contrast Given 09/29/19 1752)    ED  Course  I have reviewed the triage vital signs and the nursing notes.  Pertinent labs & imaging results that were available during my care of the patient were reviewed by me and considered in my medical decision making (see chart for details).  Clinical Course as of Sep 29 1045  Sat  Sep 29, 2019  1727 Patient reevaluated, her pain is now much better controlled and she states she is currently comfortable.  She will drop into the high 80s on room air then when I go in the room she wakes up and increases to high 90s.  RN aware, will place on [facility] oxygen and continue to monitor.     [EH]    Clinical Course User Index [EH] Ollen Gross   MDM Rules/Calculators/A&P                         Patient is a 49 year old woman postop day 9 lap chole who presents today for evaluation of abdominal pain radiating into her back.  On initial exam she appears uncomfortable and tearful.  She has been doing significantly better after her surgery until today when she had a bowel movement and since then has had sharp stabbing abdominal pain.  She denies recent constipation.  Labs are obtained and reviewed, her white count is slightly elevated however this appears slightly improved from her baseline.  CMP without elevated alk phos, bili, LFTs or other significant acute abnormalities.  Lipase is not elevated.  Lactic acid is not elevated.  UA without evidence of infection.   Patient does not have any leg swelling, is not tachycardic.  She did have mild transient hypoxia after her second dose of Dilaudid however this would rapidly resolve when anyone entered the room to speak with her, suspect this is related to pain medication rather than indicative of a pathologic process.  Chest x-ray without evidence of acute abnormality.  CT abdomen pelvis was obtained, it does show a small amount of fluid in the cholecystectomy bed however this is nonspecific.  Given that her white count is not elevated from her normal,  she is postop day 9 with a bowel movement causing pain, is afebrile I do not suspect that this is infectious, rather reactive/post operative.  If this was infectious I would expect a slower onset with acute inflammatory changes. Her pain was adequately controlled while in the emergency room, after which she was able to attempt p.o. challenge.  Recommended miralax and conservative care.   Recommended close outpatient follow-up with her surgeon.    At shift change care was transferred to [facility] who will follow pending studies, re-evaulate and determine disposition.    Note: Portions of this report may have been transcribed using voice recognition software. Every effort was made to ensure accuracy; however, inadvertent computerized transcription errors may be present  Final Clinical Impression(s) / ED Diagnoses Final diagnoses:  Epigastric pain  Post-operative state    Rx / DC Orders ED Discharge Orders    None       Lorin Glass, PA-C 09/30/19 1052    Blanchie Dessert, MD 09/30/19 1640

## 2019-09-29 NOTE — ED Notes (Signed)
Cannot tolerate pcxr at the moment due to pain level, writhing in bed and crying

## 2019-09-29 NOTE — ED Notes (Signed)
Ambulated to the bathroom.  Drinking water without issue.  Given graham crackers.  Encouraged to call for assistance as needed.

## 2019-10-20 ENCOUNTER — Encounter (HOSPITAL_BASED_OUTPATIENT_CLINIC_OR_DEPARTMENT_OTHER): Payer: Self-pay | Admitting: *Deleted

## 2019-10-20 ENCOUNTER — Other Ambulatory Visit: Payer: Self-pay

## 2019-10-20 DIAGNOSIS — K6389 Other specified diseases of intestine: Secondary | ICD-10-CM | POA: Diagnosis not present

## 2019-10-20 DIAGNOSIS — R1012 Left upper quadrant pain: Secondary | ICD-10-CM | POA: Insufficient documentation

## 2019-10-20 DIAGNOSIS — J45909 Unspecified asthma, uncomplicated: Secondary | ICD-10-CM | POA: Diagnosis not present

## 2019-10-20 DIAGNOSIS — Z20822 Contact with and (suspected) exposure to covid-19: Secondary | ICD-10-CM | POA: Diagnosis not present

## 2019-10-20 DIAGNOSIS — R1110 Vomiting, unspecified: Secondary | ICD-10-CM | POA: Diagnosis not present

## 2019-10-20 DIAGNOSIS — R109 Unspecified abdominal pain: Secondary | ICD-10-CM | POA: Diagnosis not present

## 2019-10-20 DIAGNOSIS — I700 Atherosclerosis of aorta: Secondary | ICD-10-CM | POA: Diagnosis not present

## 2019-10-20 DIAGNOSIS — I4581 Long QT syndrome: Secondary | ICD-10-CM | POA: Insufficient documentation

## 2019-10-20 DIAGNOSIS — R9431 Abnormal electrocardiogram [ECG] [EKG]: Secondary | ICD-10-CM | POA: Diagnosis not present

## 2019-10-20 DIAGNOSIS — N390 Urinary tract infection, site not specified: Secondary | ICD-10-CM | POA: Insufficient documentation

## 2019-10-20 DIAGNOSIS — R1011 Right upper quadrant pain: Secondary | ICD-10-CM | POA: Insufficient documentation

## 2019-10-20 DIAGNOSIS — F17210 Nicotine dependence, cigarettes, uncomplicated: Secondary | ICD-10-CM | POA: Diagnosis not present

## 2019-10-20 DIAGNOSIS — R1013 Epigastric pain: Secondary | ICD-10-CM | POA: Diagnosis not present

## 2019-10-20 DIAGNOSIS — N281 Cyst of kidney, acquired: Secondary | ICD-10-CM | POA: Diagnosis not present

## 2019-10-20 NOTE — ED Triage Notes (Signed)
Pt reports epigastric pain today. She has had this recurrently since having her gallbladder out in July. C/o nausea and vomited earlier

## 2019-10-21 ENCOUNTER — Emergency Department (HOSPITAL_BASED_OUTPATIENT_CLINIC_OR_DEPARTMENT_OTHER): Payer: BC Managed Care – PPO

## 2019-10-21 ENCOUNTER — Emergency Department (HOSPITAL_BASED_OUTPATIENT_CLINIC_OR_DEPARTMENT_OTHER)
Admission: EM | Admit: 2019-10-21 | Discharge: 2019-10-21 | Disposition: A | Discharge status: Home / Self Care | Payer: BC Managed Care – PPO | Attending: Emergency Medicine | Admitting: Emergency Medicine

## 2019-10-21 ENCOUNTER — Encounter (HOSPITAL_BASED_OUTPATIENT_CLINIC_OR_DEPARTMENT_OTHER): Payer: Self-pay | Admitting: Emergency Medicine

## 2019-10-21 DIAGNOSIS — R109 Unspecified abdominal pain: Secondary | ICD-10-CM

## 2019-10-21 DIAGNOSIS — N390 Urinary tract infection, site not specified: Secondary | ICD-10-CM

## 2019-10-21 DIAGNOSIS — N281 Cyst of kidney, acquired: Secondary | ICD-10-CM | POA: Diagnosis not present

## 2019-10-21 DIAGNOSIS — I700 Atherosclerosis of aorta: Secondary | ICD-10-CM | POA: Diagnosis not present

## 2019-10-21 DIAGNOSIS — K6389 Other specified diseases of intestine: Secondary | ICD-10-CM | POA: Diagnosis not present

## 2019-10-21 DIAGNOSIS — R1110 Vomiting, unspecified: Secondary | ICD-10-CM | POA: Diagnosis not present

## 2019-10-21 DIAGNOSIS — R9431 Abnormal electrocardiogram [ECG] [EKG]: Secondary | ICD-10-CM

## 2019-10-21 LAB — COMPREHENSIVE METABOLIC PANEL
ALT: 17 U/L (ref 0–44)
AST: 23 U/L (ref 15–41)
Albumin: 4.1 g/dL (ref 3.5–5.0)
Alkaline Phosphatase: 60 U/L (ref 38–126)
Anion gap: 15 (ref 5–15)
BUN: 8 mg/dL (ref 6–20)
CO2: 19 mmol/L — ABNORMAL LOW (ref 22–32)
Calcium: 8.9 mg/dL (ref 8.9–10.3)
Chloride: 104 mmol/L (ref 98–111)
Creatinine, Ser: 0.81 mg/dL (ref 0.44–1.00)
GFR calc Af Amer: >60 mL/min (ref >60)
GFR calc non Af Amer: >60 mL/min (ref >60)
Glucose, Bld: 161 mg/dL — ABNORMAL HIGH (ref 70–99)
Potassium: 3.5 mmol/L (ref 3.5–5.1)
Sodium: 138 mmol/L (ref 135–145)
Total Bilirubin: 1.2 mg/dL (ref 0.3–1.2)
Total Protein: 7.7 g/dL (ref 6.5–8.1)

## 2019-10-21 LAB — CBC
HCT: 44.4 % (ref 36.0–46.0)
Hemoglobin: 14.9 g/dL (ref 12.0–15.0)
MCH: 29.4 pg (ref 26.0–34.0)
MCHC: 33.6 g/dL (ref 30.0–36.0)
MCV: 87.7 fL (ref 80.0–100.0)
Platelets: 279 10*3/uL (ref 150–400)
RBC: 5.06 MIL/uL (ref 3.87–5.11)
RDW: 13.9 % (ref 11.5–15.5)
WBC: 18.7 10*3/uL — ABNORMAL HIGH (ref 4.0–10.5)
nRBC: 0 % (ref 0.0–0.2)

## 2019-10-21 LAB — URINALYSIS, MICROSCOPIC (REFLEX)

## 2019-10-21 LAB — URINALYSIS, ROUTINE W REFLEX MICROSCOPIC
Bilirubin Urine: NEGATIVE
Glucose, UA: NEGATIVE mg/dL
Ketones, ur: >80 mg/dL — AB
Leukocytes,Ua: NEGATIVE
Nitrite: NEGATIVE
Protein, ur: NEGATIVE mg/dL
Specific Gravity, Urine: 1.02 (ref 1.005–1.030)
pH: >9 — ABNORMAL HIGH (ref 5.0–8.0)

## 2019-10-21 LAB — LIPASE, BLOOD: Lipase: 21 U/L (ref 11–51)

## 2019-10-21 LAB — PREGNANCY, URINE: Preg Test, Ur: NEGATIVE

## 2019-10-21 IMAGING — CT CT ABD-PELV W/ CM
2 of 5 series · 17 of 46 positions shown, 19 images · IV contrast (Omnipaque)
Comparison: [DATE]

CLINICAL DATA: Epigastric pain today. Recurrent pain since
gallbladder out in [REDACTED]. Nausea and vomiting.

EXAM:
CT ABDOMEN AND PELVIS WITH CONTRAST
TECHNIQUE: Multidetector CT imaging of the abdomen and pelvis was performed
using the standard protocol following bolus administration of
intravenous contrast.
CONTRAST:  100mL OMNIPAQUE IOHEXOL 300 MG/ML  SOLN

[Series 2: axial st · axial · 0.88mm/px · z∈[-285,+105]mm · 14 of 88 slices shown, 16 images]
[im 5/88  soft-tissue]
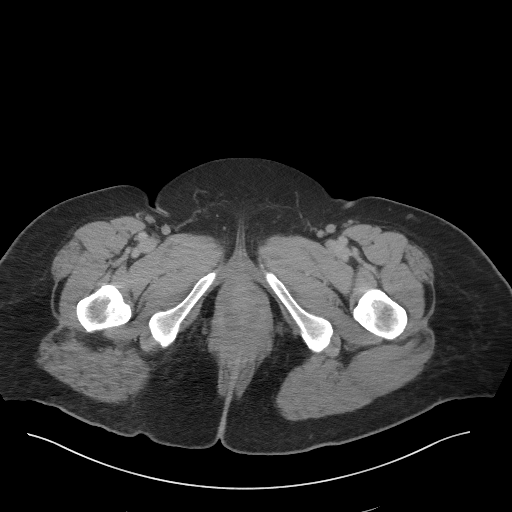
[im 5/88  bone]
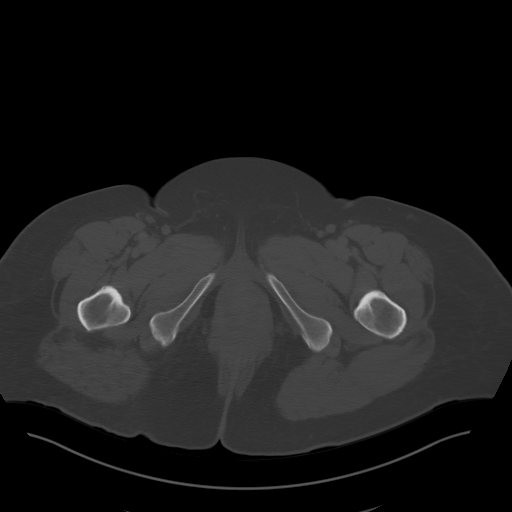
[im 10/88  soft-tissue]
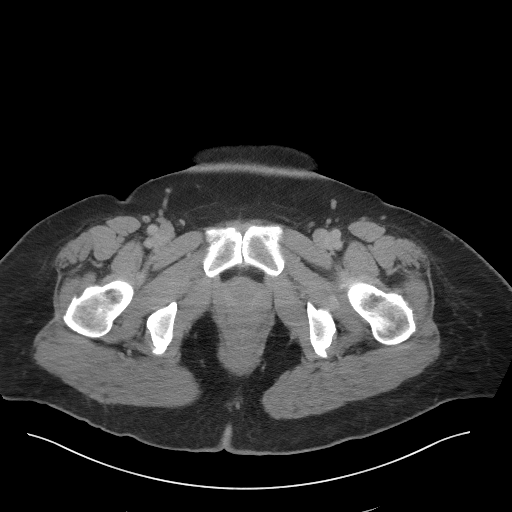
[im 19/88  soft-tissue]
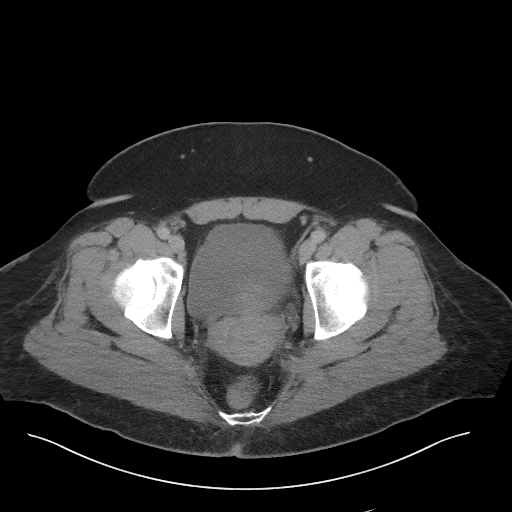
[im 23/88  soft-tissue]
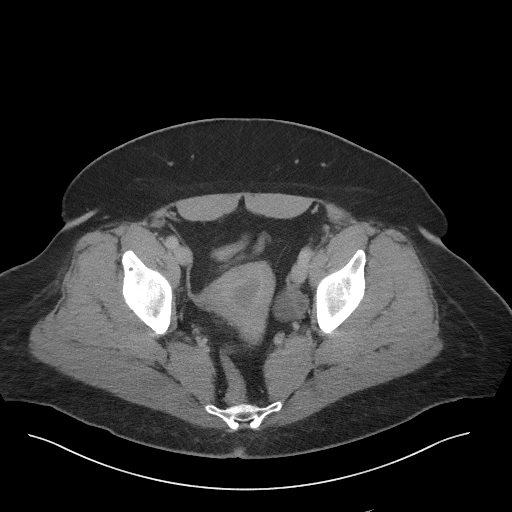
[im 28/88  soft-tissue]
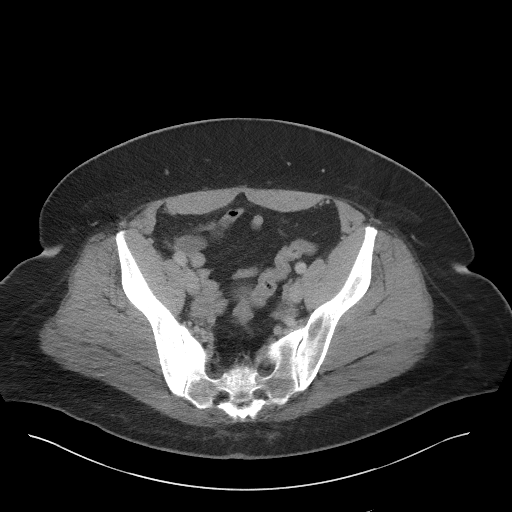
[im 37/88  soft-tissue]
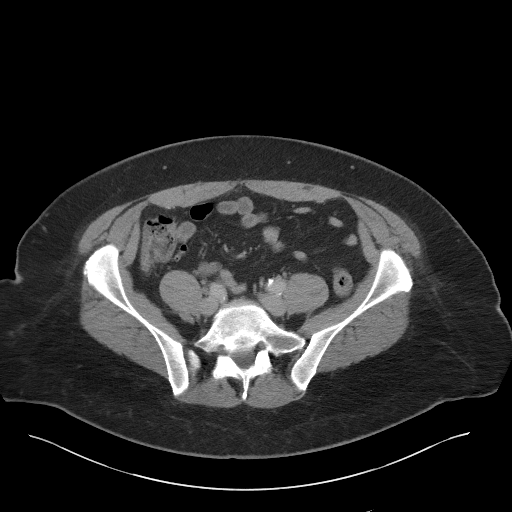
[im 42/88  soft-tissue]
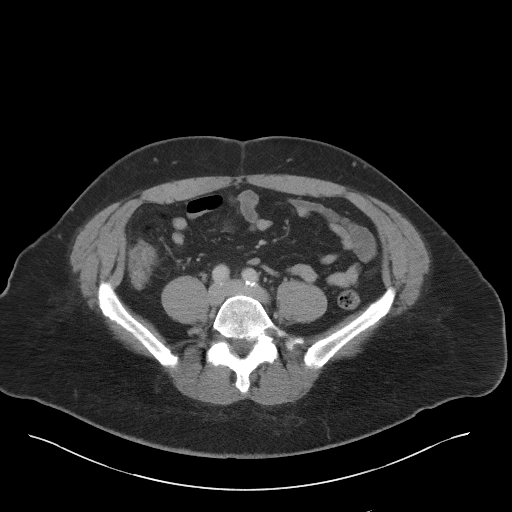
[im 46/88  soft-tissue]
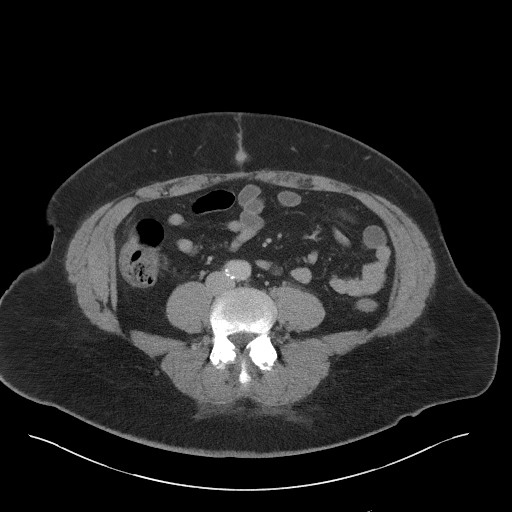
[im 51/88  soft-tissue]
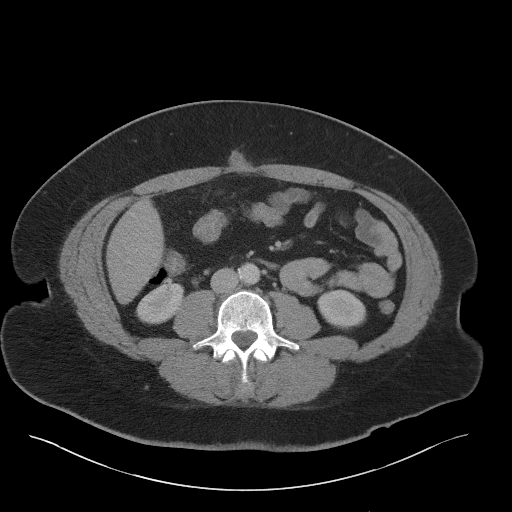
[im 51/88  bone]
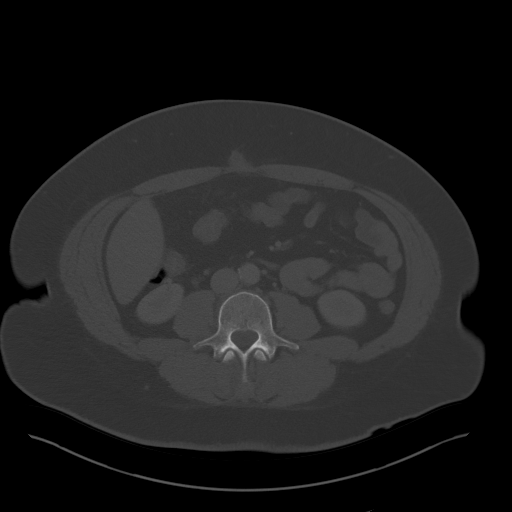
[im 60/88  soft-tissue]
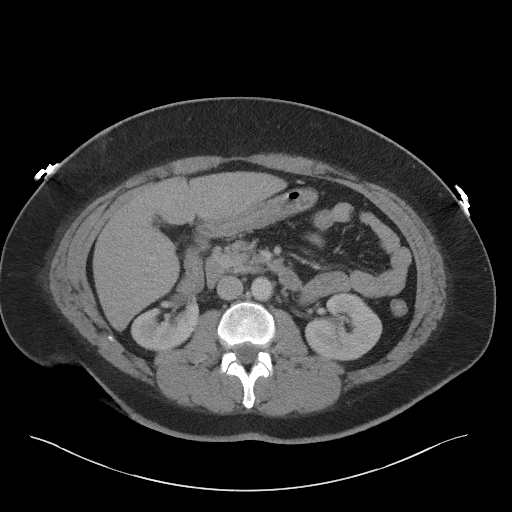
[im 65/88  soft-tissue]
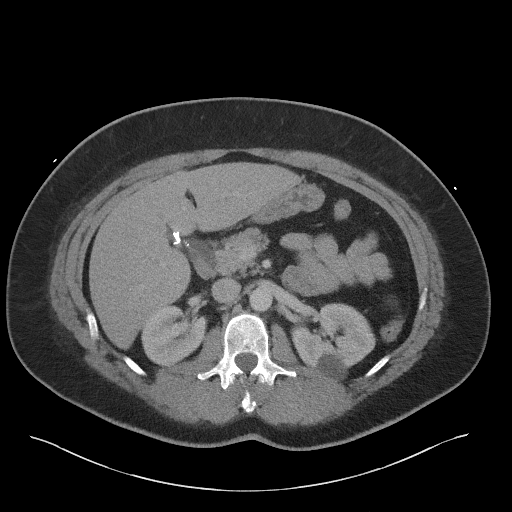
[im 69/88  soft-tissue]
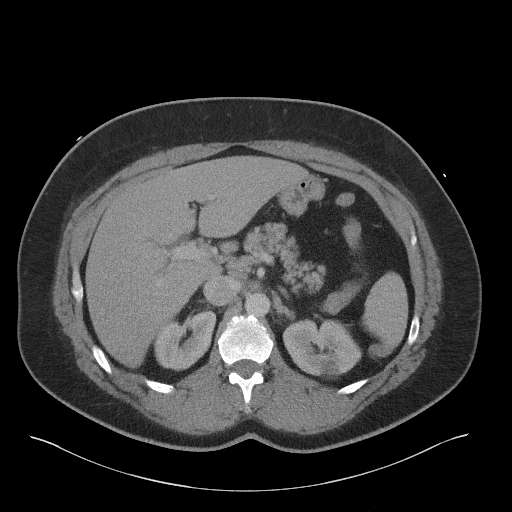
[im 78/88  soft-tissue]
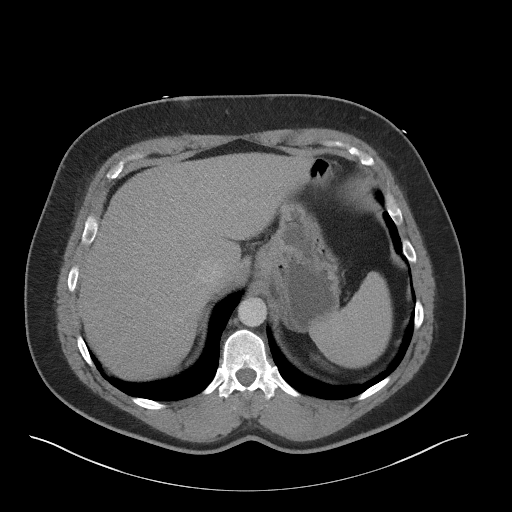
[im 83/88  soft-tissue]
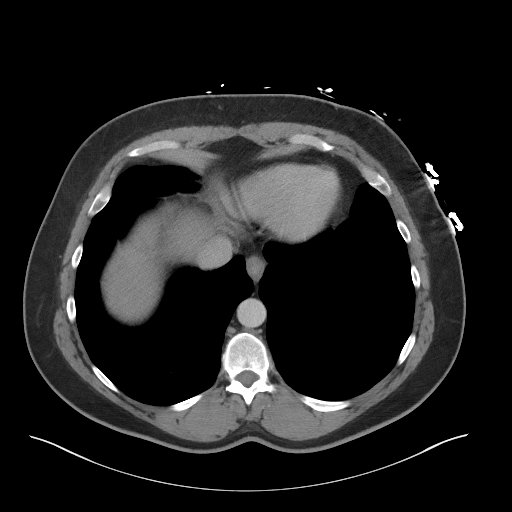

[Series 5: coronal st · coronal · 0.88mm/px · 3 of 94 slices shown]
[im 32/94  soft-tissue]
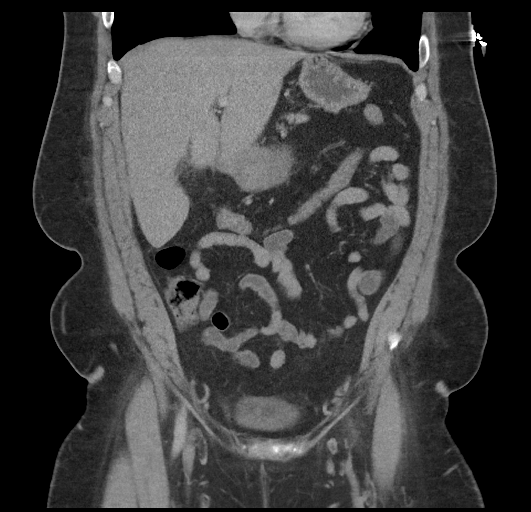
[im 42/94  soft-tissue]
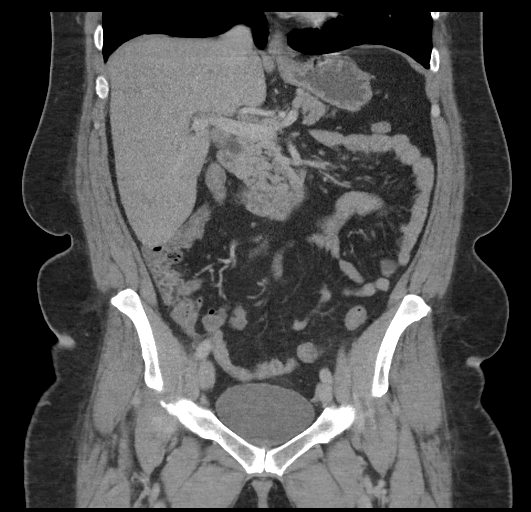
[im 52/94  soft-tissue]
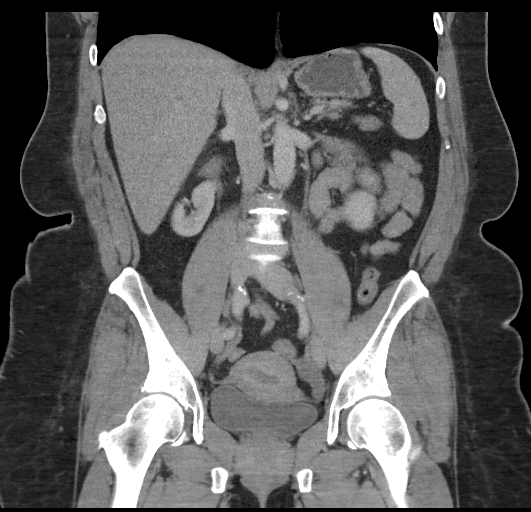

[17 of 46 positions shown; findings below may reference images not displayed]

FINDINGS: Lower chest: The lung bases are clear.

Hepatobiliary: Surgical absence of the gallbladder. No bile duct
dilatation. Liver is normal.

Pancreas: Unremarkable. No pancreatic ductal dilatation or
surrounding inflammatory changes.

Spleen: Normal in size without focal abnormality.

Adrenals/Urinary Tract: No adrenal gland nodules. Cyst on the left
kidney. Nephrograms are homogeneous. No hydronephrosis or
hydroureter. Bladder is unremarkable.

Stomach/Bowel: Stomach, small bowel, and colon are mostly
decompressed with scattered stool in the colon. The appendix is
normal.

Vascular/Lymphatic: Aortic atherosclerosis. No enlarged abdominal or
pelvic lymph nodes.

Reproductive: Uterus and bilateral adnexa are unremarkable.

Other: No abdominal wall hernia or abnormality. No abdominopelvic
ascites.

Musculoskeletal: No acute or significant osseous findings.
IMPRESSION: 1. No acute process demonstrated in the abdomen or pelvis.
2. Surgical absence of the gallbladder. No bile duct dilatation.
3. Aortic atherosclerosis.

Aortic Atherosclerosis ([UY]-[UY]).

## 2019-10-21 MED ORDER — IOHEXOL 300 MG/ML  SOLN
100.0000 mL | Freq: Once | INTRAMUSCULAR | Status: AC | PRN
  Administered 2019-10-21: 100 mL via INTRAVENOUS

## 2019-10-21 MED ORDER — SODIUM BICARBONATE 8.4 % IV SOLN
50.0000 meq | Freq: Once | INTRAVENOUS | Status: AC
  Administered 2019-10-21: 50 meq via INTRAVENOUS
  Filled 2019-10-21: qty 50

## 2019-10-21 MED ORDER — SUCRALFATE 1 GM/10ML PO SUSP
1.0000 g | Freq: Three times a day (TID) | ORAL | 0 refills | Status: DC
Start: 2019-10-21 — End: 2022-10-10

## 2019-10-21 MED ORDER — SULFAMETHOXAZOLE-TRIMETHOPRIM 800-160 MG PO TABS
1.0000 | ORAL_TABLET | Freq: Two times a day (BID) | ORAL | 0 refills | Status: AC
Start: 2019-10-21 — End: 2019-10-28

## 2019-10-21 MED ORDER — DICYCLOMINE HCL 10 MG/ML IM SOLN
20.0000 mg | Freq: Once | INTRAMUSCULAR | Status: AC
  Administered 2019-10-21: 20 mg via INTRAMUSCULAR
  Filled 2019-10-21: qty 2

## 2019-10-21 MED ORDER — PHENAZOPYRIDINE HCL 200 MG PO TABS
200.0000 mg | ORAL_TABLET | Freq: Three times a day (TID) | ORAL | 0 refills | Status: DC | PRN
Start: 2019-10-21 — End: 2019-10-21

## 2019-10-21 MED ORDER — ALUM & MAG HYDROXIDE-SIMETH 200-200-20 MG/5ML PO SUSP
30.0000 mL | Freq: Once | ORAL | Status: AC
  Administered 2019-10-21: 30 mL via ORAL
  Filled 2019-10-21: qty 30

## 2019-10-21 MED ORDER — SODIUM CHLORIDE 0.9 % IV SOLN
1.0000 g | Freq: Once | INTRAVENOUS | Status: AC
  Administered 2019-10-21: 1 g via INTRAVENOUS
  Filled 2019-10-21: qty 10

## 2019-10-21 MED ORDER — SODIUM CHLORIDE 0.9 % IV BOLUS
500.0000 mL | Freq: Once | INTRAVENOUS | Status: AC
  Administered 2019-10-21: 500 mL via INTRAVENOUS

## 2019-10-21 MED ORDER — FENTANYL CITRATE (PF) 100 MCG/2ML IJ SOLN
50.0000 ug | Freq: Once | INTRAMUSCULAR | Status: AC
  Administered 2019-10-21: 50 ug via INTRAVENOUS
  Filled 2019-10-21: qty 2

## 2019-10-21 MED ORDER — PHENAZOPYRIDINE HCL 200 MG PO TABS
200.0000 mg | ORAL_TABLET | Freq: Three times a day (TID) | ORAL | 0 refills | Status: DC | PRN
Start: 2019-10-21 — End: 2024-01-21

## 2019-10-21 MED ORDER — ONDANSETRON HCL 4 MG/2ML IJ SOLN
4.0000 mg | Freq: Once | INTRAMUSCULAR | Status: AC
  Administered 2019-10-21: 4 mg via INTRAVENOUS
  Filled 2019-10-21: qty 2

## 2019-10-21 MED ORDER — LIDOCAINE VISCOUS HCL 2 % MT SOLN
15.0000 mL | Freq: Once | OROMUCOSAL | Status: AC
  Administered 2019-10-21: 15 mL via ORAL
  Filled 2019-10-21: qty 15

## 2019-10-21 MED ORDER — FENTANYL CITRATE (PF) 100 MCG/2ML IJ SOLN
50.0000 ug | INTRAMUSCULAR | Status: DC | PRN
  Administered 2019-10-21: 50 ug via INTRAVENOUS
  Filled 2019-10-21: qty 2

## 2019-10-21 NOTE — ED Notes (Addendum)
Prompted to provide urine sample. Reports 10/10 pain similar to previous gallbladder pain. Tylenol not effective. States nausea but tolerates oral fluids.

## 2019-10-21 NOTE — ED Provider Notes (Signed)
[facility] EMERGENCY DEPARTMENT Provider Note   CSN: [id number] Arrival date & time: 10/20/19  2335     History Chief Complaint  Patient presents with  . Abdominal Pain    Hailey Bowman is a 49 y.o. female.  The history is provided by the patient.  Abdominal Pain Pain location:  LUQ, RUQ and epigastric Pain quality: cramping   Pain radiates to:  Does not radiate Pain severity:  Severe Onset quality:  Sudden Timing:  Constant Progression:  Unchanged Chronicity:  Recurrent Context: previous surgery   Context: not alcohol use   Context comment:  States recurrent gall bladder pain but this was removed, was seen post op for this pain 2 weeks ago as well. Scheduled to See GI this week to discuss this pain.  Relieved by:  Nothing Worsened by:  Nothing Ineffective treatments:  None tried Associated symptoms: no anorexia, no belching, no chest pain, no constipation, no cough, no diarrhea, no dysuria, no fatigue, no fever, no nausea, no shortness of breath, no sore throat and no vomiting   Risk factors: no alcohol abuse and not elderly        Past Medical History:  Diagnosis Date  . Anxiety   . Asthma   . Contraceptive use    condoms  . Depression   . History of UTI    as a child     Patient Active Problem List   Diagnosis Date Noted  . Acute cholecystitis 09/20/2019  . Allergic rhinitis 08/23/2013  . Otitis media 03/09/2012  . Tobacco abuse 03/09/2012  . Asthma   . ANXIETY 11/30/2006    Past Surgical History:  Procedure Laterality Date  . ANKLE SURGERY Left   . BLADDER SURGERY     as a child x 2  . CHOLECYSTECTOMY N/A 09/20/2019   Procedure: LAPAROSCOPIC CHOLECYSTECTOMY WITH INTRAOPERATIVE CHOLANGIOGRAM;  Surgeon: Violeta Gelinas, MD;  Location: [facility] OR;  Service: General;  Laterality: N/A;  . DILATION AND CURETTAGE OF UTERUS    . WISDOM TOOTH EXTRACTION       OB History   No obstetric history on file.     Family History  Problem  Relation Age of Onset  . Breast cancer Mother   . Diabetes Other        GF?  . CAD Other        GM CHF,CAD  . Colon cancer Neg Hx     Social History   Tobacco Use  . Smoking status: Current Every Day Smoker    Types: Cigarettes  . Smokeless tobacco: Never Used  . Tobacco comment: 1 ppd  Vaping Use  . Vaping Use: Never used  Substance Use Topics  . Alcohol use: Yes    Comment: occ  . Drug use: Yes    Types: Marijuana    Home Medications Prior to Admission medications   Medication Sig Start Date End Date Taking? Authorizing Provider  acetaminophen (TYLENOL) 500 MG tablet Take 500-1,000 mg by mouth daily as needed for mild pain or headache.   Yes [provider]  buPROPion (WELLBUTRIN) 100 MG tablet Take 1 tablet (100 mg total) by mouth 2 (two) times daily. Patient taking differently: Take 300 mg by mouth daily.  07/16/14  Yes Paz, Nolon Rod, MD  sertraline (ZOLOFT) 50 MG tablet Take 150 mg by mouth daily.   Yes [provider]  albuterol (PROAIR HFA) 108 (90 BASE) MCG/ACT inhaler Inhale 2 puffs into the lungs every 6 (six) hours as  needed for wheezing or shortness of breath. 01/10/15   Wanda Plump, MD  oxyCODONE (OXY IR/ROXICODONE) 5 MG immediate release tablet Take 1 tablet (5 mg total) by mouth every 6 (six) hours as needed for severe pain. 09/21/19   Meuth, Brooke A, PA-C  phenazopyridine (PYRIDIUM) 200 MG tablet Take 1 tablet (200 mg total) by mouth 3 (three) times daily as needed for pain. 10/21/19   Moiz Ryant, MD  polyethylene glycol (MIRALAX / GLYCOLAX) 17 g packet Take 17 g by mouth daily as needed for mild constipation. 09/21/19   Meuth, Brooke A, PA-C  potassium chloride SA (KLOR-CON) 20 MEQ tablet Take 1 tablet (20 mEq total) by mouth 2 (two) times daily for 3 days. 09/18/19 09/21/19  Sabas Sous, MD  sucralfate (CARAFATE) 1 GM/10ML suspension Take 10 mLs (1 g total) by mouth 4 (four) times daily -  with meals and at bedtime. 10/21/19   Oriel Ojo, MD    sulfamethoxazole-trimethoprim (BACTRIM DS) 800-160 MG tablet Take 1 tablet by mouth 2 (two) times daily for 7 days. 10/21/19 10/28/19  Zayon Trulson, MD    Allergies    Ibuprofen and Penicillins  Review of Systems   Review of Systems  Constitutional: Negative for fatigue and fever.  HENT: Negative for sore throat.   Respiratory: Negative for cough and shortness of breath.   Cardiovascular: Negative for chest pain.  Gastrointestinal: Positive for abdominal pain. Negative for anorexia, constipation, diarrhea, nausea and vomiting.  Genitourinary: Negative for dysuria and flank pain.  Musculoskeletal: Negative for back pain.  Skin: Negative for color change.  Neurological: Negative for dizziness.  Psychiatric/Behavioral: Negative for agitation.  All other systems reviewed and are negative.   Physical Exam Updated Vital Signs BP (!) 183/92   Pulse 89   Temp 98.6 F (37 C) (Oral)   Resp 13   Ht 5\' 9"  (1.753 m)   Wt 113.4 kg   LMP 09/20/2019 (Approximate)   SpO2 97%   BMI 36.92 kg/m   Physical Exam  ED Results / Procedures / Treatments   Labs (all labs ordered are listed, but only abnormal results are displayed) Labs Reviewed  COMPREHENSIVE METABOLIC PANEL - Abnormal; Notable for the following components:      Result Value   CO2 19 (*)    Glucose, Bld 161 (*)    All other components within normal limits  CBC - Abnormal; Notable for the following components:   WBC 18.7 (*)    All other components within normal limits  URINALYSIS, ROUTINE W REFLEX MICROSCOPIC - Abnormal; Notable for the following components:   APPearance CLOUDY (*)    pH >9.0 (*)    Hgb urine dipstick MODERATE (*)    Ketones, ur >80 (*)    All other components within normal limits  URINALYSIS, MICROSCOPIC (REFLEX) - Abnormal; Notable for the following components:   Bacteria, UA MANY (*)    All other components within normal limits  LIPASE, BLOOD  PREGNANCY, URINE    EKG None  Radiology CT  ABDOMEN PELVIS W CONTRAST  Result Date: 10/21/2019 CLINICAL DATA:  Epigastric pain today. Recurrent pain since gallbladder out in July. Nausea and vomiting. EXAM: CT ABDOMEN AND PELVIS WITH CONTRAST TECHNIQUE: Multidetector CT imaging of the abdomen and pelvis was performed using the standard protocol following bolus administration of intravenous contrast. CONTRAST:  August OMNIPAQUE IOHEXOL 300 MG/ML  SOLN COMPARISON:  09/29/2019 FINDINGS: Lower chest: The lung bases are clear. Hepatobiliary: Surgical absence of the gallbladder. No  bile duct dilatation. Liver is normal. Pancreas: Unremarkable. No pancreatic ductal dilatation or surrounding inflammatory changes. Spleen: Normal in size without focal abnormality. Adrenals/Urinary Tract: No adrenal gland nodules. Cyst on the left kidney. Nephrograms are homogeneous. No hydronephrosis or hydroureter. Bladder is unremarkable. Stomach/Bowel: Stomach, small bowel, and colon are mostly decompressed with scattered stool in the colon. The appendix is normal. Vascular/Lymphatic: Aortic atherosclerosis. No enlarged abdominal or pelvic lymph nodes. Reproductive: Uterus and bilateral adnexa are unremarkable. Other: No abdominal wall hernia or abnormality. No abdominopelvic ascites. Musculoskeletal: No acute or significant osseous findings. IMPRESSION: 1. No acute process demonstrated in the abdomen or pelvis. 2. Surgical absence of the gallbladder. No bile duct dilatation. 3. Aortic atherosclerosis. Aortic Atherosclerosis (ICD10-I70.0). Electronically Signed   By: Burman Nieves M.D.   On: 10/21/2019 03:06    Procedures Procedures (including critical care time)  Medications Ordered in ED Medications  fentaNYL (SUBLIMAZE) injection 50 mcg (50 mcg Intravenous Given 10/21/19 0052)  ondansetron (ZOFRAN) injection 4 mg (4 mg Intravenous Given 10/21/19 0053)  dicyclomine (BENTYL) injection 20 mg (20 mg Intramuscular Given 10/21/19 0227)  alum & mag hydroxide-simeth  (MAALOX/MYLANTA) 200-200-20 MG/5ML suspension 30 mL (30 mLs Oral Given 10/21/19 0308)    And  lidocaine (XYLOCAINE) 2 % viscous mouth solution 15 mL (15 mLs Oral Given 10/21/19 0308)  fentaNYL (SUBLIMAZE) injection 50 mcg (50 mcg Intravenous Given 10/21/19 0226)  sodium bicarbonate injection 50 mEq (50 mEq Intravenous Given 10/21/19 0222)  sodium chloride 0.9 % bolus 500 mL (500 mLs Intravenous New Bag/Given 10/21/19 0309)  cefTRIAXone (ROCEPHIN) 1 g in sodium chloride 0.9 % 100 mL IVPB (1 g Intravenous New Bag/Given 10/21/19 0310)  iohexol (OMNIPAQUE) 300 MG/ML solution 100 mL (100 mLs Intravenous Contrast Given 10/21/19 0250)    ED Course  I have reviewed the triage vital signs and the nursing notes.  Pertinent labs & imaging results that were available during my care of the patient were reviewed by me and considered in my medical decision making (see chart for details). I consulted with pharmacy prior to using rocephin given unknown PCN allergy.  She had had Rocephin successfully in the ED once prior without a reaction and so one does was given in the ED.   I wanted to try toradol for pain but patient has an allergy to ibuprofen and has never had toradol.  She has prolongation of the QT and I could not use haldol for that reason.  I will treat for gerd with carafate as it does not prolong the QT.  I can not treat the UTi with keflex due to unknown PCN allergy.  I will treat UTI outpatient with bactrim.  I have informed the patient of the prolongation of the QT and need for close follow up with cardiology. Patient verbalizes understanding and agrees to follow up;   She will see GI to exclude ulcers etc. I suspect there is a functional component to this pain as she has been seen twice and no answer has been found.  I think this is a slippery slope giving this patient repeated doses of narcotics.  I will start carafate and a bland diet.    Strict return precautions given.   Final Clinical Impression(s) /  ED Diagnoses Final diagnoses:  Prolonged QT interval  Abdominal pain, unspecified abdominal location  Lower urinary tract infectious disease  Return for intractable cough, coughing up blood,fevers >100.4 unrelieved by medication, shortness of breath, intractable vomiting, chest pain, shortness of breath, weakness,numbness, changes in  speech, facial asymmetry,abdominal pain, passing out,Inability to tolerate liquids or food, cough, altered mental status or any concerns. No signs of systemic illness or infection. The patient is nontoxic-appearing on exam and vital signs are within normal limits.   I have reviewed the triage vital signs and the nursing notes. Pertinent labs &imaging results that were available during my care of the patient were reviewed by me and considered in my medical decision making (see chart for details).After history, exam, and medical workup I feel the patient has beenappropriately medically screened and is safe for discharge home. Pertinent diagnoses were discussed with the patient. Patient was given return precautions.  Rx / DC Orders ED Discharge Orders         Ordered    sulfamethoxazole-trimethoprim (BACTRIM DS) 800-160 MG tablet  2 times daily     Discontinue  Reprint     10/21/19 0324    phenazopyridine (PYRIDIUM) 200 MG tablet  3 times daily PRN,   Status:  Discontinued     Reprint     10/21/19 0324    sucralfate (CARAFATE) 1 GM/10ML suspension  3 times daily with meals & bedtime     Discontinue  Reprint     10/21/19 0324    phenazopyridine (PYRIDIUM) 200 MG tablet  3 times daily PRN     Discontinue  Reprint     10/21/19 0337           Da Authement, MD 10/21/19 [id number]

## 2019-10-22 ENCOUNTER — Ambulatory Visit (INDEPENDENT_AMBULATORY_CARE_PROVIDER_SITE_OTHER): Payer: BC Managed Care – PPO | Admitting: Physician Assistant

## 2019-10-22 ENCOUNTER — Encounter: Payer: Self-pay | Admitting: Physician Assistant

## 2019-10-22 VITALS — BP 116/76 | HR 80 | Ht 68.0 in | Wt 245.1 lb

## 2019-10-22 DIAGNOSIS — R1013 Epigastric pain: Secondary | ICD-10-CM | POA: Diagnosis not present

## 2019-10-22 DIAGNOSIS — R112 Nausea with vomiting, unspecified: Secondary | ICD-10-CM

## 2019-10-22 MED ORDER — OMEPRAZOLE 40 MG PO CPDR
40.0000 mg | DELAYED_RELEASE_CAPSULE | Freq: Every morning | ORAL | 3 refills | Status: DC
Start: 2019-10-22 — End: 2019-11-16

## 2019-10-22 MED ORDER — LORAZEPAM 0.5 MG PO TABS
ORAL_TABLET | ORAL | 0 refills | Status: DC
Start: 2019-10-22 — End: 2019-11-08

## 2019-10-22 MED ORDER — HYOSCYAMINE SULFATE 0.125 MG SL SUBL
0.1250 mg | SUBLINGUAL_TABLET | Freq: Four times a day (QID) | SUBLINGUAL | 2 refills | Status: DC | PRN
Start: 2019-10-22 — End: 2019-12-03

## 2019-10-22 NOTE — Patient Instructions (Addendum)
If you are age 49 or older, your body mass index should be between 23-30. Your Body mass index is 37.27 kg/m. If this is out of the aforementioned range listed, please consider follow up with your Primary Care Provider.  If you are age 18 or younger, your body mass index should be between 19-25. Your Body mass index is 37.27 kg/m. If this is out of the aformentioned range listed, please consider follow up with your Primary Care Provider.   You have been scheduled for an MRI at [facility] on 10/30/19. Your appointment time is 8:00 am. Please arrive 30 minutes prior to your appointment time for registration purposes. Please make certain not to have anything to eat or drink 4 hours prior to your test. In addition, if you have any metal in your body, have a pacemaker or defibrillator, please be sure to let your ordering physician know. This test typically takes 45 minutes to 1 hour to complete. Should you need to reschedule, please call [phone] to do so.  START Hyoscamine sublingual tablets 1 one the tongue every 6 hours as needed for abdominal pain. START Omeprazole 40 mg 1 capsule every morning before breakfast. STOP Cannabis Use  Follow up pending your imaging.

## 2019-10-22 NOTE — Addendum Note (Signed)
Addended by: Arville Care on: 10/22/2019 03:17 PM   Modules accepted: Orders

## 2019-10-22 NOTE — Progress Notes (Signed)
Subjective:    Patient ID: Hailey Bowman, female    DOB: [birth year], 49 y.o.   MRN: [id number]  HPI Hailey Bowman is a pleasant 49 year old white female, new to GI today and referred after recent ER visits with complaints of severe epigastric pain. Patient has history of asthma, anxiety and recently found to have prolonged QT. She had presented to the emergency room early in July with these same complaints and was found on ultrasound to have cholelithiasis and a decompressed gallbladder contributing to finding of wall thickening, no pericholecystic fluid CBD 4.5 mm, fatty changes of the liver.  LFTs were unremarkable with exception of T bili of 1.4, and CBC with WBC of 15.5.  She was seen in consultation by surgery and underwent laparoscopic cholecystectomy and IOC per Dr. Violeta Gelinas. Patient says that within a week of the surgery she had recurrence of her same symptoms with episodes of severe epigastric pain at times radiating around into her back.  These have now been occurring a couple of times per week and she had an ER visit on 09/29/2019 and again yesterday. She says this last episode lasted for multiple hours and was associated with vomiting.  She has not had any associated fever or chills.  She describes the pain as a burning squeezing type sensation and is constant while present.  Once the episode has resolved she feels fine.  She says the only relief that she can get during 1 of these episodes has been an analgesic in the ER.  She has not been on any regular aspirin or NSAIDs. She did have CT of the abdomen and pelvis done with both of her recent ER visits which were unremarkable with no evidence of ductal dilation, no fluid collection in the gallbladder fossa and otherwise negative study. Labs from ER visit yesterday with WBC of 18.7 hemoglobin 14.9 glucose 161 and LFTs within normal limits.  She did have an abnormal UA consistent with UTI and has been started on Bactrim.  She says  these episodes have occurred randomly, not necessarily associated with p.o. intake and a couple of them have occurred just after a bowel movement. On further questioning, patient has been using marijuana on a daily basis over the past several years.  She has not had any change in this pattern recently. Review of Systems Pertinent positive and negative review of systems were noted in the above HPI section.  All other review of systems was otherwise negative.  Outpatient Encounter Medications as of 10/22/2019  Medication Sig  . acetaminophen (TYLENOL) 500 MG tablet Take 500-1,000 mg by mouth daily as needed for mild pain or headache.  . albuterol (PROAIR HFA) 108 (90 BASE) MCG/ACT inhaler Inhale 2 puffs into the lungs every 6 (six) hours as needed for wheezing or shortness of breath.  Marland Kitchen buPROPion (WELLBUTRIN) 100 MG tablet Take 1 tablet (100 mg total) by mouth 2 (two) times daily. (Patient taking differently: Take 300 mg by mouth daily. )  . phenazopyridine (PYRIDIUM) 200 MG tablet Take 1 tablet (200 mg total) by mouth 3 (three) times daily as needed for pain.  Marland Kitchen sertraline (ZOLOFT) 100 MG tablet Take 150 mg by mouth daily.  . sucralfate (CARAFATE) 1 GM/10ML suspension Take 10 mLs (1 g total) by mouth 4 (four) times daily -  with meals and at bedtime.  . sulfamethoxazole-trimethoprim (BACTRIM DS) 800-160 MG tablet Take 1 tablet by mouth 2 (two) times daily for 7 days.  . hyoscyamine (LEVSIN SL) 0.125 MG  SL tablet Place 1 tablet (0.125 mg total) under the tongue every 6 (six) hours as needed (abdominal pain).  Marland Kitchen omeprazole (PRILOSEC) 40 MG capsule Take 1 capsule (40 mg total) by mouth in the morning.  . [DISCONTINUED] oxyCODONE (OXY IR/ROXICODONE) 5 MG immediate release tablet Take 1 tablet (5 mg total) by mouth every 6 (six) hours as needed for severe pain.  . [DISCONTINUED] polyethylene glycol (MIRALAX / GLYCOLAX) 17 g packet Take 17 g by mouth daily as needed for mild constipation.  . [DISCONTINUED]  potassium chloride SA (KLOR-CON) 20 MEQ tablet Take 1 tablet (20 mEq total) by mouth 2 (two) times daily for 3 days.  . [DISCONTINUED] sertraline (ZOLOFT) 50 MG tablet Take 150 mg by mouth daily.   No facility-administered encounter medications on file as of 10/22/2019.   Allergies  Allergen Reactions  . Ibuprofen Hives  . Penicillins Other (See Comments)    Childhood, pt does not know reaction   Patient Active Problem List   Diagnosis Date Noted  . Acute cholecystitis 09/20/2019  . Allergic rhinitis 08/23/2013  . Otitis media 03/09/2012  . Tobacco abuse 03/09/2012  . Asthma   . ANXIETY 11/30/2006   Social History   Socioeconomic History  . Marital status: Married    Spouse name: Not on file  . Number of children: 1  . Years of education: Not on file  . Highest education level: Not on file  Occupational History  . Occupation: not working at present  Tobacco Use  . Smoking status: Current Every Day Smoker    Types: Cigarettes  . Smokeless tobacco: Never Used  . Tobacco comment: 1 ppd  Vaping Use  . Vaping Use: Never used  Substance and Sexual Activity  . Alcohol use: Yes    Comment: occ  . Drug use: Yes    Types: Marijuana  . Sexual activity: Yes    Birth control/protection: None  Other Topics Concern  . Not on file  Social History Narrative   G2P1   Social Determinants of Health   Financial Resource Strain:   . Difficulty of Paying Living Expenses:   Food Insecurity:   . Worried About Programme researcher, broadcasting/film/video in the Last Year:   . Barista in the Last Year:   Transportation Needs:   . Freight forwarder (Medical):   Marland Kitchen Lack of Transportation (Non-Medical):   Physical Activity:   . Days of Exercise per Week:   . Minutes of Exercise per Session:   Stress:   . Feeling of Stress :   Social Connections:   . Frequency of Communication with Friends and Family:   . Frequency of Social Gatherings with Friends and Family:   . Attends Religious Services:     . Active Member of Clubs or Organizations:   . Attends Banker Meetings:   Marland Kitchen Marital Status:   Intimate Partner Violence:   . Fear of Current or Ex-Partner:   . Emotionally Abused:   Marland Kitchen Physically Abused:   . Sexually Abused:     Ms. Kizziah family history includes Breast cancer in her mother; Diabetes in her maternal grandfather; Heart disease in her maternal grandfather and maternal grandmother.      Objective:    Vitals:   10/22/19 0911  BP: 116/76  Pulse: 80    Physical Exam Well-developed well-nourished WF in no acute distress.  Height, Weight,245  BMI 37.2  HEENT; nontraumatic normocephalic, EOMI, PER R LA, sclera anicteric. Oropharynx;not done  Neck; supple, no JVD Cardiovascular; regular rate and rhythm with S1-S2, no murmur rub or gallop Pulmonary; Clear bilaterally Abdomen; soft, nontender, nondistended, no palpable mass or hepatosplenomegaly, bowel sounds are active, healing lap sites Rectal;not done Skin; benign exam, no jaundice rash or appreciable lesions Extremities; no clubbing cyanosis or edema skin warm and dry Neuro/Psych; alert and oriented x4, grossly nonfocal mood and affect appropriate       Assessment & Plan:   #74 49 year old white female with episodic severe epigastric pain with nausea and sometimes vomiting. Patient is status post laparoscopic cholecystectomy 09/20/2019 after presenting to the emergency room with 1 of these episodes and found to have cholelithiasis on ultrasound. IOC was negative, and LFTs normal on admit. Patient has continued to have recurrent episodes of this exact same pain postoperatively with 2 recent ER visits. Repeat imaging with CT unremarkable. She has had some associated leukocytosis, but also had a UTI at the time of ER visit yesterday. Etiology of her pain is not clear, rule out gastropathy, peptic ulcer disease, sphincter of Oddi spasm, , choledocholithiasis or passage of sludge, also consider  cannabis hyperemesis syndrome which can be associated with pain  #2 anxiety 3.  Asthma 4.  Prolonged QT  Plan; Start omeprazole 40 mg p.o. every morning short-term Trial of Levsin sublingual 1 every 6 hours as needed for pain Patient will be scheduled for MRCP.  If MRCP  unrevealing may need EGD with Dr. Lavon Paganini . Ativan 0.5 mg x 130 to 45 minutes prior to MRCP I discussed cannabis hyperemesis with the patient and advised she stop cannabis use.  She has been miserable and is in agreement.  Taheera Thomann Oswald Hillock PA-C 10/22/2019   Cc: Wanda Plump, MD

## 2019-10-29 NOTE — Progress Notes (Signed)
Reviewed and agree with documentation and assessment and plan. K. Veena Alexzandria Massman , MD   

## 2019-10-30 ENCOUNTER — Other Ambulatory Visit: Payer: Self-pay

## 2019-10-30 ENCOUNTER — Ambulatory Visit (HOSPITAL_COMMUNITY)
Admission: RE | Admit: 2019-10-30 | Discharge: 2019-10-30 | Disposition: A | Discharge status: Home / Self Care | Payer: BC Managed Care – PPO | Source: Ambulatory Visit | Attending: Physician Assistant | Admitting: Physician Assistant

## 2019-10-30 ENCOUNTER — Other Ambulatory Visit: Payer: Self-pay | Admitting: Physician Assistant

## 2019-10-30 DIAGNOSIS — K838 Other specified diseases of biliary tract: Secondary | ICD-10-CM | POA: Diagnosis not present

## 2019-10-30 DIAGNOSIS — R112 Nausea with vomiting, unspecified: Secondary | ICD-10-CM

## 2019-10-30 DIAGNOSIS — N281 Cyst of kidney, acquired: Secondary | ICD-10-CM | POA: Diagnosis not present

## 2019-10-30 DIAGNOSIS — R1013 Epigastric pain: Secondary | ICD-10-CM | POA: Insufficient documentation

## 2019-10-30 DIAGNOSIS — Z9049 Acquired absence of other specified parts of digestive tract: Secondary | ICD-10-CM | POA: Diagnosis not present

## 2019-10-30 DIAGNOSIS — R935 Abnormal findings on diagnostic imaging of other abdominal regions, including retroperitoneum: Secondary | ICD-10-CM | POA: Diagnosis not present

## 2019-10-30 IMAGING — MR MR ABDOMEN WO/W CM MRCP
19 of 22 series · 44 of 48 positions shown · IV contrast (gadavist)
Comparison: CT [DATE]

CLINICAL DATA: RIGHT upper quadrant pain. Severe epigastric pain.
Postcholecystectomy in [REDACTED] with continual pain.

EXAM:
MRI ABDOMEN WITHOUT AND WITH CONTRAST (INCLUDING MRCP)
TECHNIQUE: Multiplanar multisequence MR imaging of the abdomen was performed
both before and after the administration of intravenous contrast.
Heavily T2-weighted images of the biliary and pancreatic ducts were
obtained, and three-dimensional MRCP images were rendered by post
processing.
CONTRAST:  10mL GADAVIST GADOBUTROL 1 MMOL/ML IV SOLN

[Series 3: T2 fat-sat · axial · 6.0mm · 1.25mm/px · 1 of 40 slices shown]
[im 1/40]
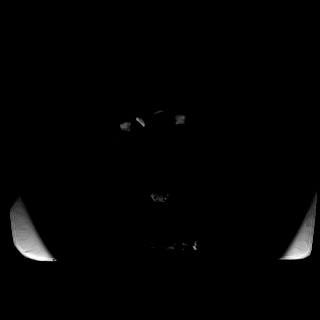

[Series 5: DWI · axial · 6.0mm · 1.57mm/px · z∈[-219,+77]mm · 2 of 84 slices shown (1 of 2)]
[im 1/84]
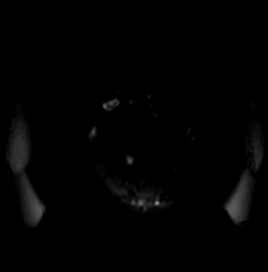
[im 84/84]
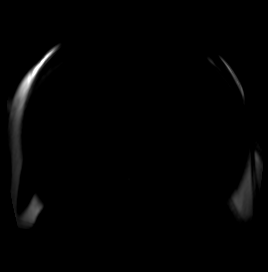

[Series 6: DWI · axial · 6.0mm · 1.57mm/px · z∈[-219,+77]mm · 2 of 42 slices shown (2 of 2)]
[im 1/42]
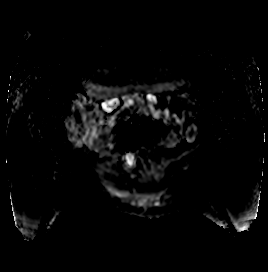
[im 42/42]
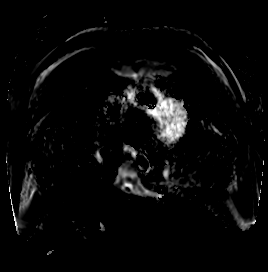

[Series 7: T2 · coronal · 6.0mm · 1.64mm/px · 1 of 36 slices shown (1 of 2)]
[im 1/36]
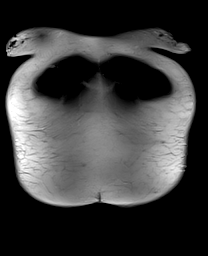

[Series 8: T1 · axial · 3.4mm · 1.31mm/px · z∈[-202,+66]mm · 3 of 80 slices shown (1 of 2)]
[im 1/80]
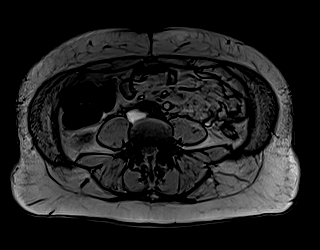
[im 40/80]
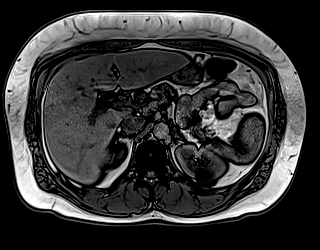
[im 80/80]
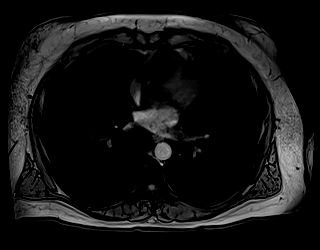

[Series 9: T1 · axial · 3.4mm · 1.31mm/px · z∈[-202,+66]mm · 3 of 80 slices shown (2 of 2)]
[im 1/80]
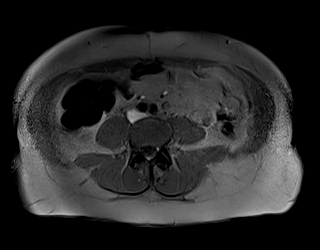
[im 40/80]
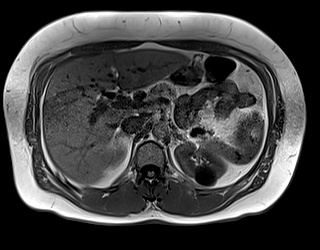
[im 80/80]
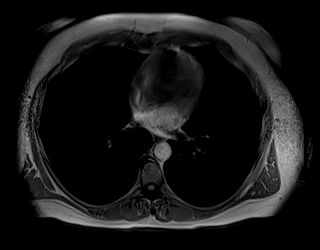

[Series 11: cor obl thk · sagittal · 50.0mm · 0.78mm/px · 1 of 9 slices shown]
[im 1/9]
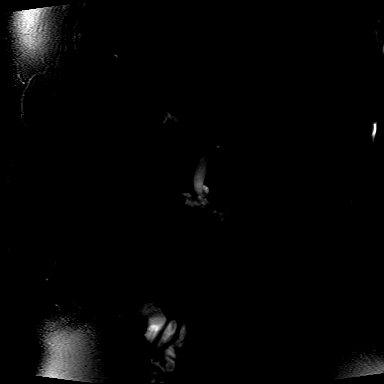

[Series 13: cor_3d_spc_trig · coronal · 1.5mm · 0.49mm/px · 2 of 52 slices shown]
[im 1/52]
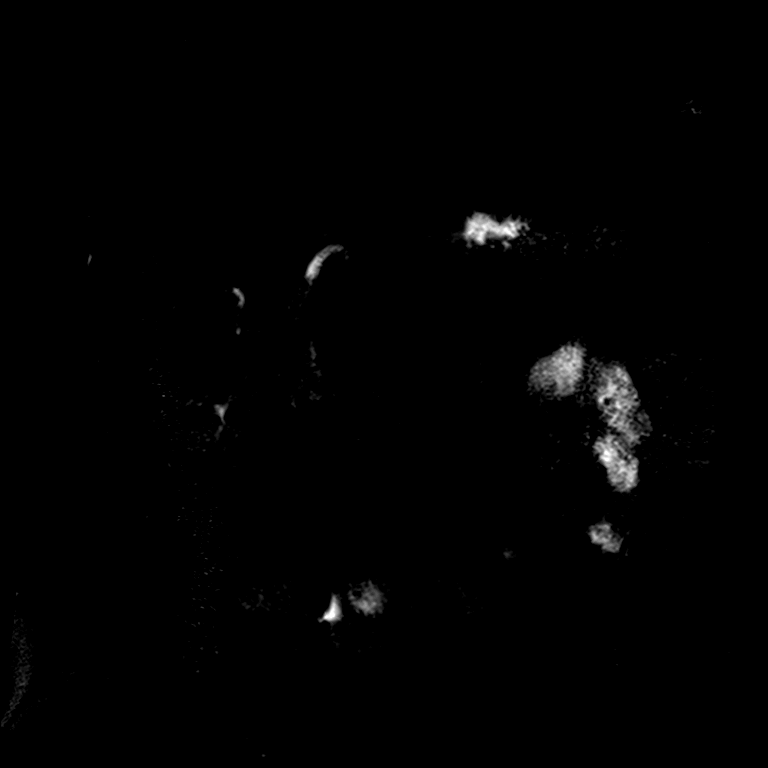
[im 52/52]
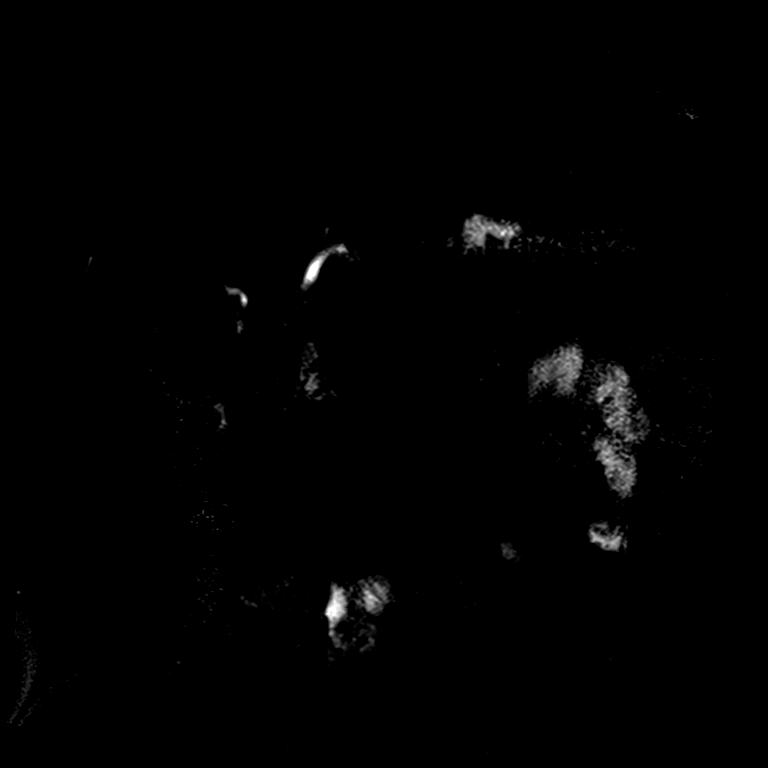

[Series 15: T2 · axial · 6.0mm · 1.56mm/px · 1 of 40 slices shown (2 of 2)]
[im 1/40]
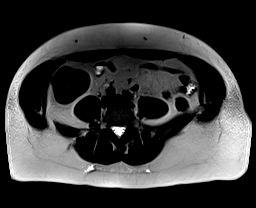

[Series 17: T1 dynamic · axial · 3.0mm · 1.31mm/px · z∈[-202,+59]mm · 3 of 88 slices shown (1 of 6)]
[im 1/88]
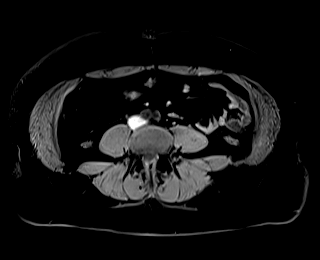
[im 44/88]
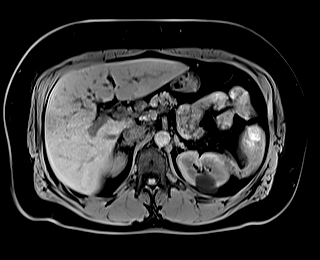
[im 88/88]
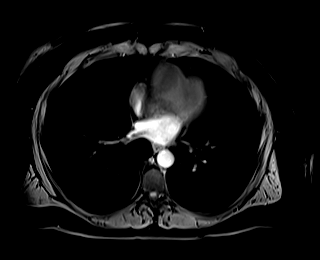

[Series 20: T1 dynamic · axial · 3.0mm · 1.31mm/px · z∈[-202,+59]mm · 3 of 88 slices shown (2 of 6)]
[im 1/88]
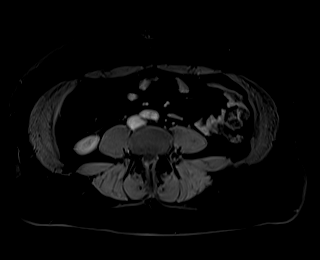
[im 44/88]
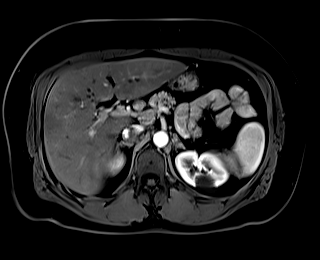
[im 88/88]
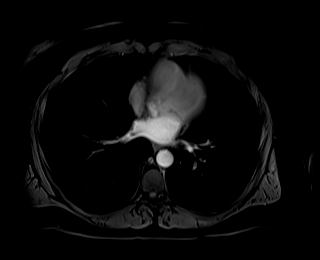

[Series 22: T1 dynamic · axial · 3.0mm · 1.31mm/px · z∈[-202,+59]mm · 3 of 88 slices shown (3 of 6)]
[im 1/88]
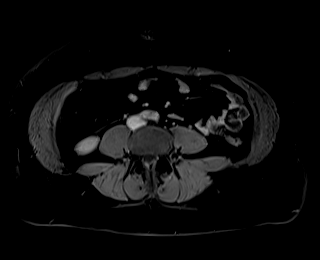
[im 44/88]
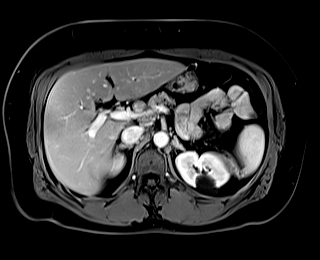
[im 88/88]
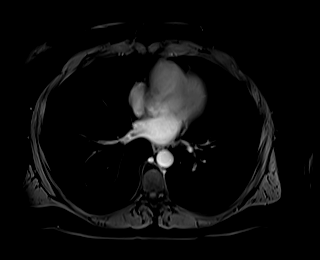

[Series 24: T1 dynamic · axial · 3.0mm · 1.31mm/px · z∈[-202,+59]mm · 3 of 88 slices shown (4 of 6)]
[im 1/88]
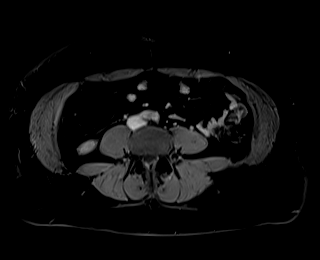
[im 44/88]
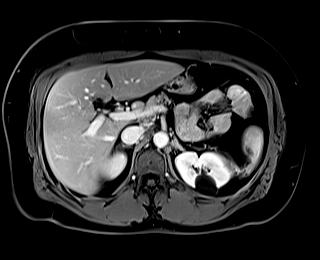
[im 88/88]
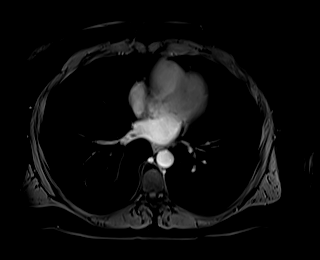

[Series 26: T1 dynamic · coronal · 5.0mm · 1.31mm/px · 3 of 72 slices shown (5 of 6)]
[im 1/72]
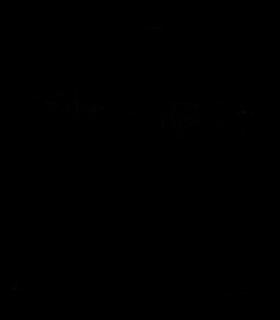
[im 36/72]
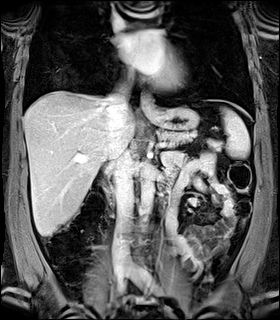
[im 72/72]
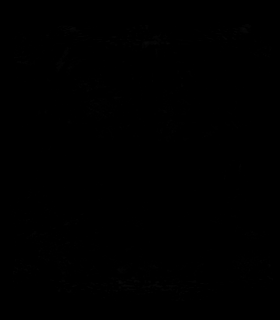

[Series 28: T1 dynamic · axial · 3.0mm · 1.31mm/px · z∈[-202,+59]mm · 3 of 88 slices shown (6 of 6)]
[im 1/88]
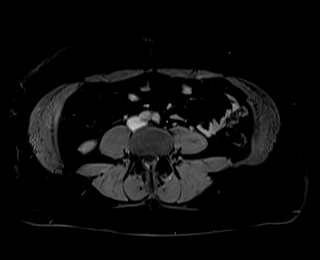
[im 44/88]
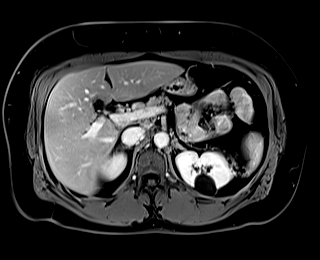
[im 88/88]
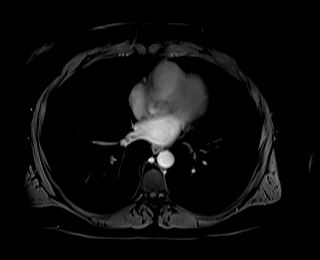

[Series 102: sub_20 sec · axial · 3.0mm · 1.31mm/px · z∈[-202,+59]mm · 3 of 88 slices shown]
[im 1/88]
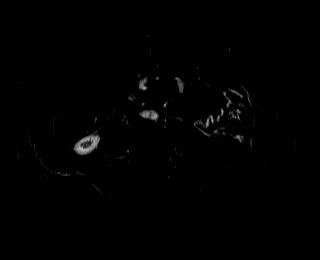
[im 44/88]
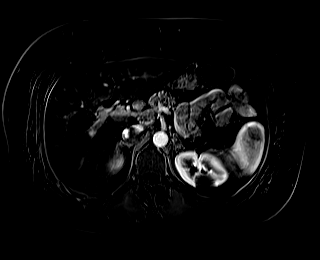
[im 88/88]
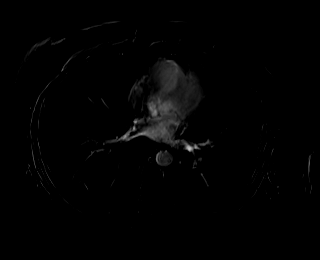

[Series 103: sub_45 sec · axial · 3.0mm · 1.31mm/px · z∈[-202,+59]mm · 3 of 88 slices shown]
[im 1/88]
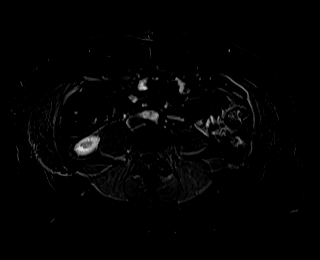
[im 44/88]
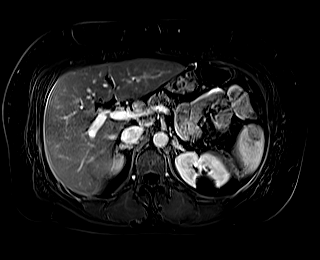
[im 88/88]
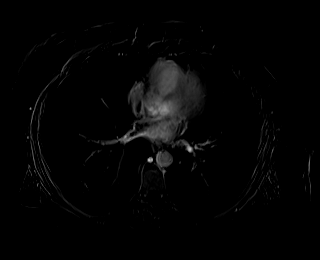

[Series 104: sub_90 sec · axial · 3.0mm · 1.31mm/px · z∈[-187,+59]mm · 3 of 83 slices shown]
[im 1/83]
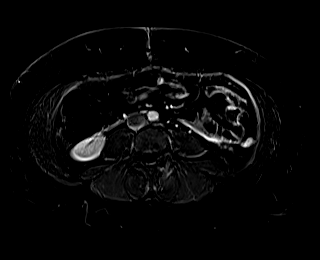
[im 42/83]
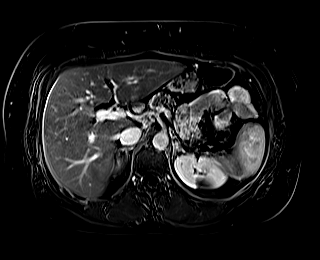
[im 83/83]
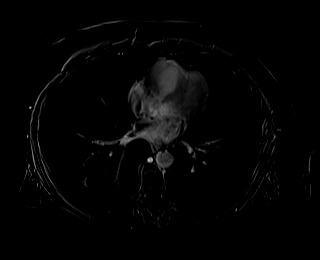

[Series 106: sub_delay · axial · 3.0mm · 1.31mm/px · 1 of 30 slices shown]
[im 1/30]
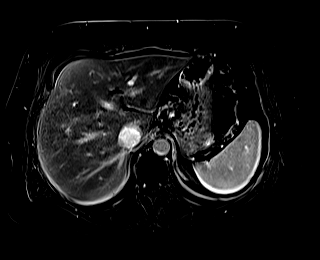

[44 of 48 positions shown; findings below may reference images not displayed]

FINDINGS: Lower chest: Lung bases are clear.

Hepatobiliary: No focal hepatic lesion. Normal hepatic parenchymal
intensity.

No intrahepatic biliary duct dilatation. Postcholecystectomy. No
fluid collections in the gallbladder fossa.

The common hepatic duct is prominent at 8 mm. The common bile duct
measures 7 mm. This is typical mild dilatation following
cholecystectomy. No choledocholithiasis present. Small cystic duct
remnant noted.

Pancreas: Pancreas is normal. No ductal dilatation. No pancreatic
inflammation. No variant pancreatic ductal anatomy.

Spleen: Normal spleen

Adrenals/urinary tract: Adrenal glands normal. Nonenhancing cyst of
the LEFT kidney.

Stomach/Bowel: Stomach limited view of the bowel is unremarkable.

Vascular/Lymphatic: Abdominal aorta normal. No upper abdominal
adenopathy. 8 mm

Portal lymph node is within normal limits.

Other: No free fluid.

Musculoskeletal: No aggressive osseous lesion.
IMPRESSION: 1. Mild prominence of extrahepatic bile ducts following
cholecystectomy is typical. No choledocholithiasis. No intrahepatic
duct dilatation
2. No fluid collection in the gallbladder fossa. No Peri hepatic
fluid collections.
3. Normal pancreas without inflammation.

## 2019-10-30 IMAGING — MR MR 3D RECON AT SCANNER
19 of 22 series · 44 of 48 positions shown · IV contrast (gadavist)
Comparison: CT [DATE]

CLINICAL DATA: RIGHT upper quadrant pain. Severe epigastric pain.
Postcholecystectomy in [REDACTED] with continual pain.

EXAM:
MRI ABDOMEN WITHOUT AND WITH CONTRAST (INCLUDING MRCP)
TECHNIQUE: Multiplanar multisequence MR imaging of the abdomen was performed
both before and after the administration of intravenous contrast.
Heavily T2-weighted images of the biliary and pancreatic ducts were
obtained, and three-dimensional MRCP images were rendered by post
processing.
CONTRAST:  10mL GADAVIST GADOBUTROL 1 MMOL/ML IV SOLN

[Series 3: T2 fat-sat · axial · 6.0mm · 1.25mm/px · 1 of 40 slices shown]
[im 1/40]
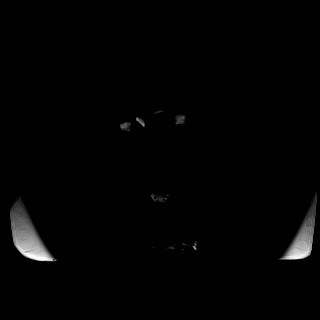

[Series 5: DWI · axial · 6.0mm · 1.57mm/px · z∈[-219,+77]mm · 2 of 84 slices shown (1 of 2)]
[im 1/84]
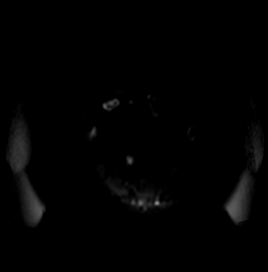
[im 84/84]
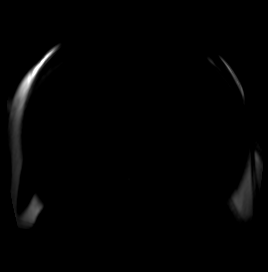

[Series 6: DWI · axial · 6.0mm · 1.57mm/px · z∈[-219,+77]mm · 2 of 42 slices shown (2 of 2)]
[im 1/42]
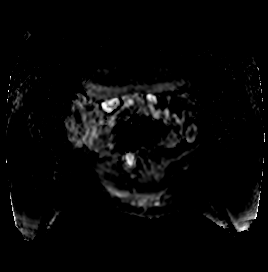
[im 42/42]
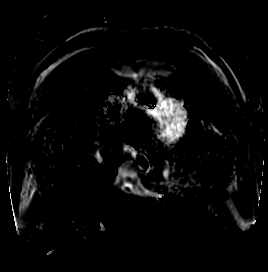

[Series 7: T2 · coronal · 6.0mm · 1.64mm/px · 1 of 36 slices shown (1 of 2)]
[im 1/36]
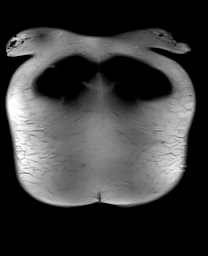

[Series 8: T1 · axial · 3.4mm · 1.31mm/px · z∈[-202,+66]mm · 3 of 80 slices shown (1 of 2)]
[im 1/80]
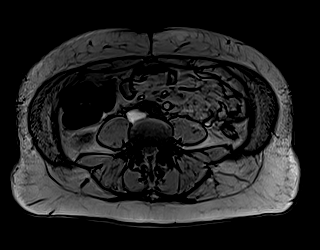
[im 40/80]
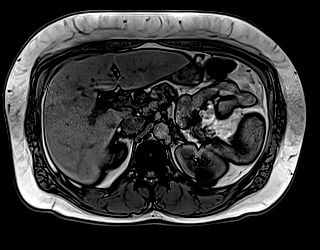
[im 80/80]
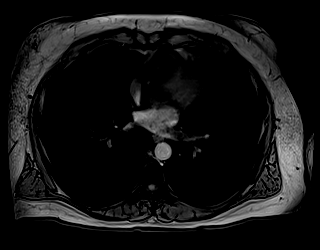

[Series 9: T1 · axial · 3.4mm · 1.31mm/px · z∈[-202,+66]mm · 3 of 80 slices shown (2 of 2)]
[im 1/80]
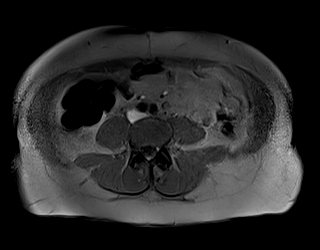
[im 40/80]
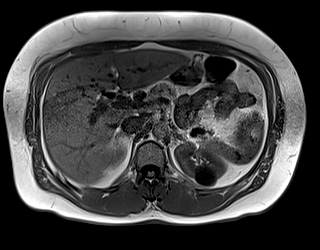
[im 80/80]
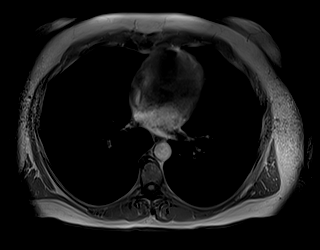

[Series 11: cor obl thk · sagittal · 50.0mm · 0.78mm/px · 1 of 9 slices shown]
[im 1/9]
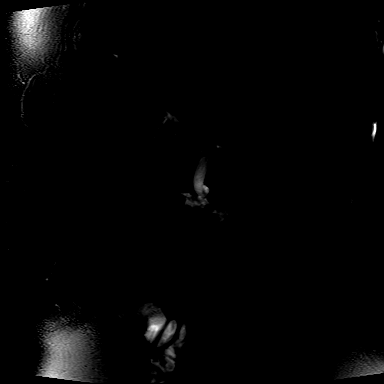

[Series 13: cor_3d_spc_trig · coronal · 1.5mm · 0.49mm/px · 2 of 52 slices shown]
[im 1/52]
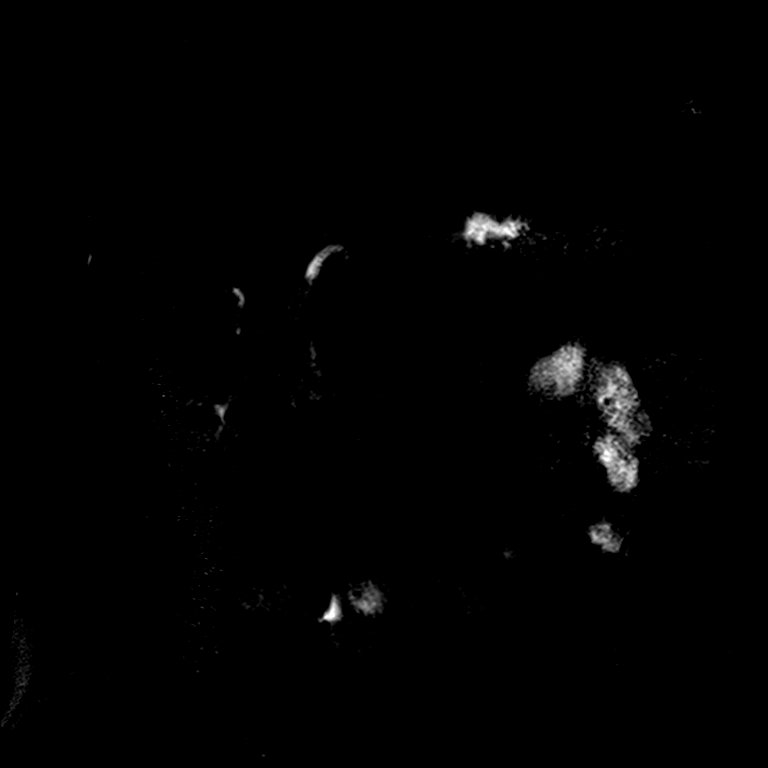
[im 52/52]
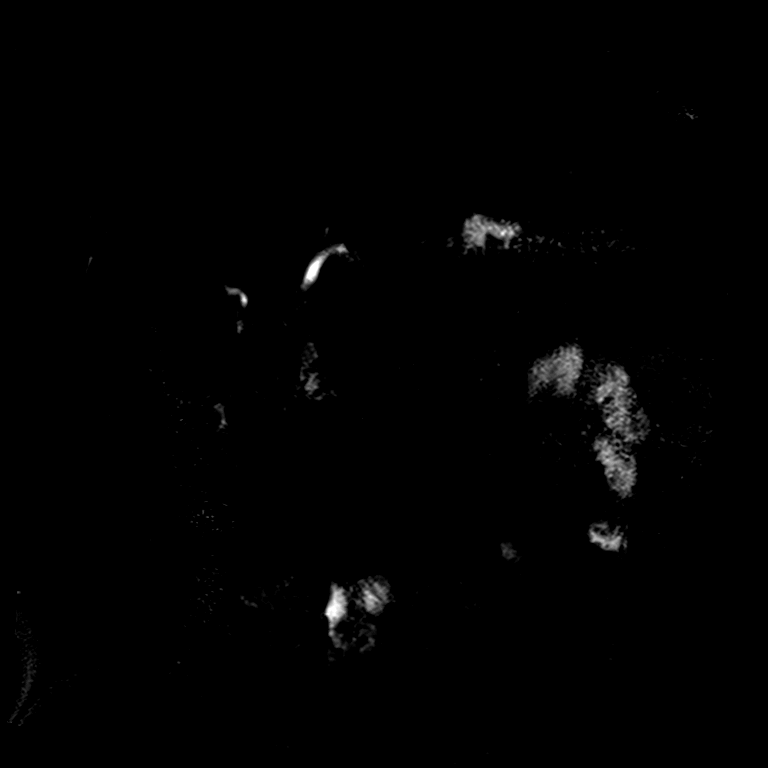

[Series 15: T2 · axial · 6.0mm · 1.56mm/px · 1 of 40 slices shown (2 of 2)]
[im 1/40]
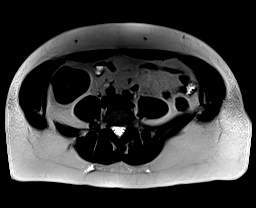

[Series 17: T1 dynamic · axial · 3.0mm · 1.31mm/px · z∈[-202,+59]mm · 3 of 88 slices shown (1 of 6)]
[im 1/88]
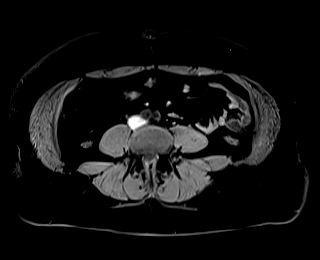
[im 44/88]
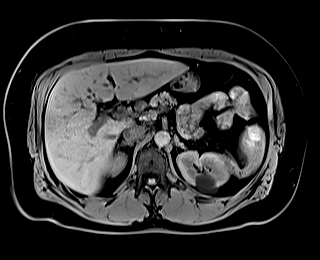
[im 88/88]
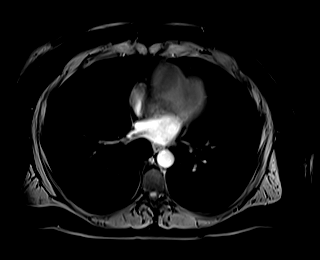

[Series 20: T1 dynamic · axial · 3.0mm · 1.31mm/px · z∈[-202,+59]mm · 3 of 88 slices shown (2 of 6)]
[im 1/88]
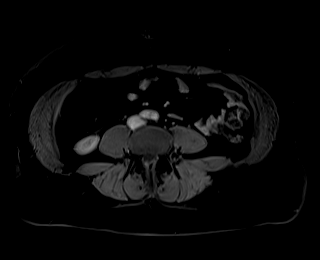
[im 44/88]
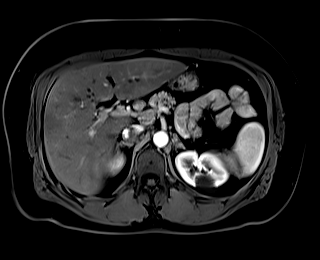
[im 88/88]
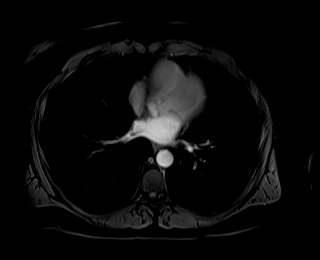

[Series 22: T1 dynamic · axial · 3.0mm · 1.31mm/px · z∈[-202,+59]mm · 3 of 88 slices shown (3 of 6)]
[im 1/88]
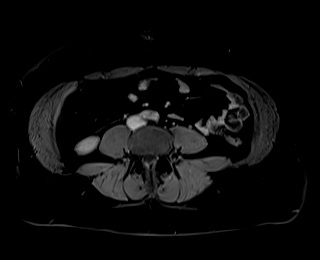
[im 44/88]
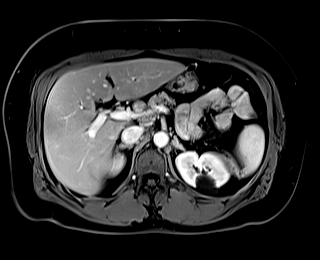
[im 88/88]
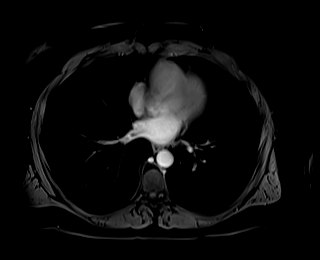

[Series 24: T1 dynamic · axial · 3.0mm · 1.31mm/px · z∈[-202,+59]mm · 3 of 88 slices shown (4 of 6)]
[im 1/88]
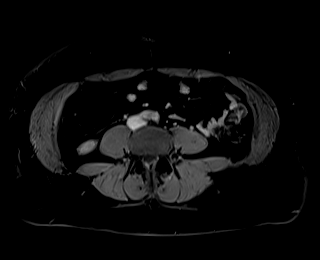
[im 44/88]
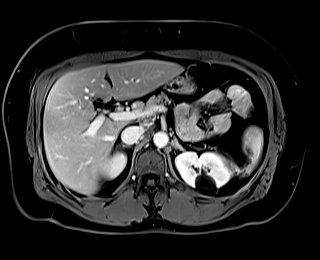
[im 88/88]
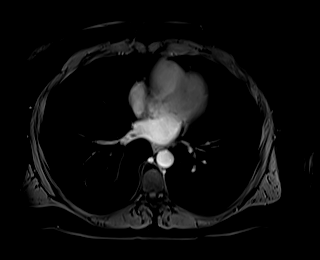

[Series 26: T1 dynamic · coronal · 5.0mm · 1.31mm/px · 3 of 72 slices shown (5 of 6)]
[im 1/72]
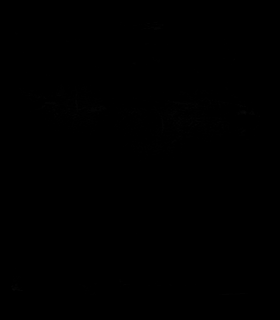
[im 36/72]
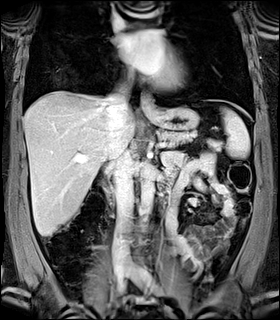
[im 72/72]
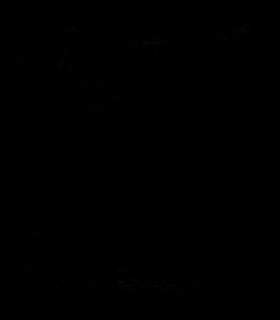

[Series 28: T1 dynamic · axial · 3.0mm · 1.31mm/px · z∈[-202,+59]mm · 3 of 88 slices shown (6 of 6)]
[im 1/88]
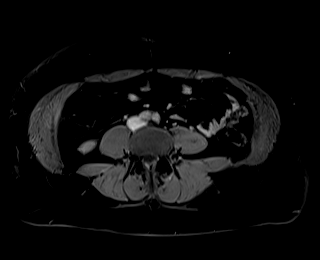
[im 44/88]
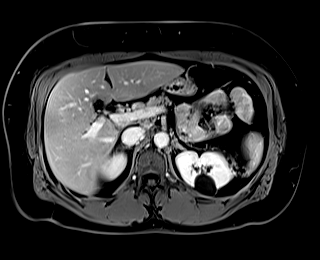
[im 88/88]
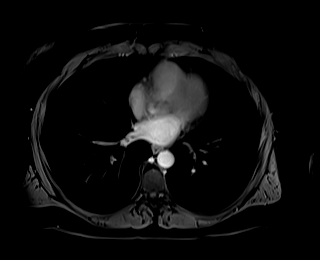

[Series 102: sub_20 sec · axial · 3.0mm · 1.31mm/px · z∈[-202,+59]mm · 3 of 88 slices shown]
[im 1/88]
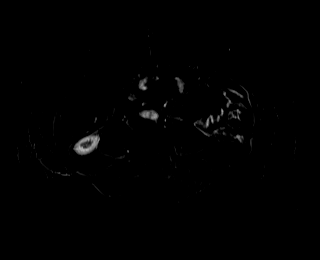
[im 44/88]
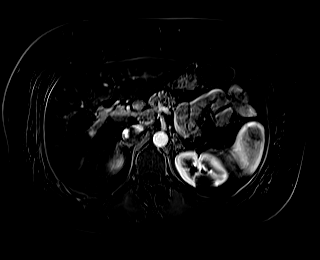
[im 88/88]
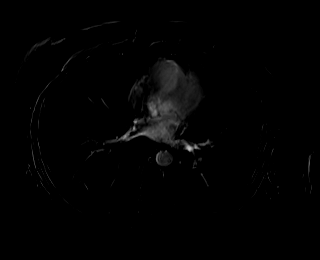

[Series 103: sub_45 sec · axial · 3.0mm · 1.31mm/px · z∈[-202,+59]mm · 3 of 88 slices shown]
[im 1/88]
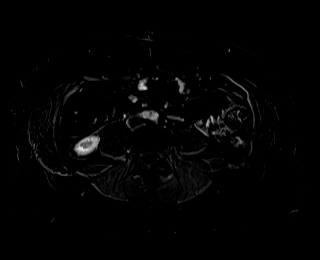
[im 44/88]
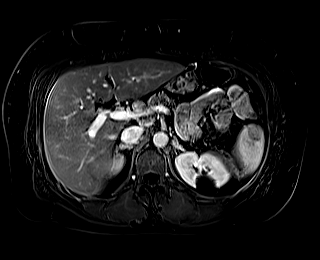
[im 88/88]
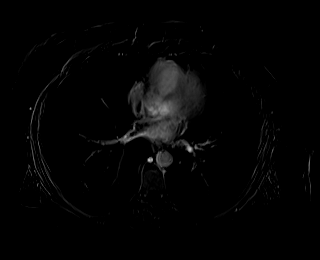

[Series 104: sub_90 sec · axial · 3.0mm · 1.31mm/px · z∈[-187,+59]mm · 3 of 83 slices shown]
[im 1/83]
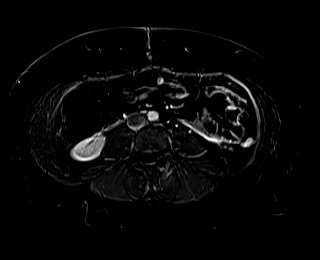
[im 42/83]
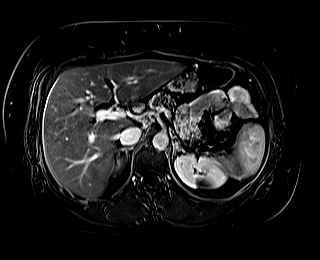
[im 83/83]
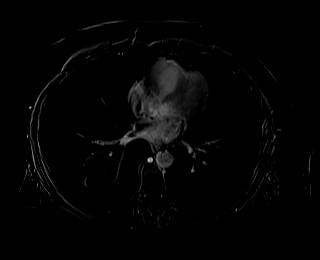

[Series 106: sub_delay · axial · 3.0mm · 1.31mm/px · 1 of 30 slices shown]
[im 1/30]
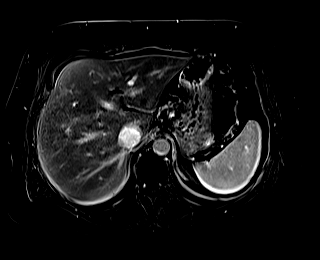

[44 of 48 positions shown; findings below may reference images not displayed]

FINDINGS: Lower chest: Lung bases are clear.

Hepatobiliary: No focal hepatic lesion. Normal hepatic parenchymal
intensity.

No intrahepatic biliary duct dilatation. Postcholecystectomy. No
fluid collections in the gallbladder fossa.

The common hepatic duct is prominent at 8 mm. The common bile duct
measures 7 mm. This is typical mild dilatation following
cholecystectomy. No choledocholithiasis present. Small cystic duct
remnant noted.

Pancreas: Pancreas is normal. No ductal dilatation. No pancreatic
inflammation. No variant pancreatic ductal anatomy.

Spleen: Normal spleen

Adrenals/urinary tract: Adrenal glands normal. Nonenhancing cyst of
the LEFT kidney.

Stomach/Bowel: Stomach limited view of the bowel is unremarkable.

Vascular/Lymphatic: Abdominal aorta normal. No upper abdominal
adenopathy. 8 mm

Portal lymph node is within normal limits.

Other: No free fluid.

Musculoskeletal: No aggressive osseous lesion.
IMPRESSION: 1. Mild prominence of extrahepatic bile ducts following
cholecystectomy is typical. No choledocholithiasis. No intrahepatic
duct dilatation
2. No fluid collection in the gallbladder fossa. No Peri hepatic
fluid collections.
3. Normal pancreas without inflammation.

## 2019-10-30 MED ORDER — GADOBUTROL 1 MMOL/ML IV SOLN
10.0000 mL | Freq: Once | INTRAVENOUS | Status: AC | PRN
  Administered 2019-10-30: 10 mL via INTRAVENOUS

## 2019-11-06 ENCOUNTER — Telehealth: Payer: Self-pay

## 2019-11-06 NOTE — Telephone Encounter (Signed)
Spoke with the patient about her imaging results. Her symptoms have continued. The pill for the pain has helped (Levsin) but she has continued to have the "attacks."  She has not felt the need to vomit nor has she attempted to induce vomiting to obtain relief. She has abstained from marijuana use. She is agreeable to EGD. Okay to schedule?

## 2019-11-06 NOTE — Telephone Encounter (Signed)
-----   Message from Sammuel Cooper, PA-C sent at 11/05/2019 11:18 AM EDT ----- Please let pt know the MRI /MRCP is negative , no CBD stones , no fluid collection . See how she is feeling - if still having sxs will need EGD with Dr Edwyna Shell

## 2019-11-06 NOTE — Telephone Encounter (Signed)
Yes , ok to schedule, continue Levsin prn and abstaining from cannibus

## 2019-11-07 NOTE — Telephone Encounter (Signed)
Spoke with the patient. Advised of plan. Nurse visit tomorrow at 10 am. EGD 11/14/19 at 10 am. Patient agrees to this plan of care.

## 2019-11-08 ENCOUNTER — Other Ambulatory Visit: Payer: Self-pay

## 2019-11-08 ENCOUNTER — Ambulatory Visit (AMBULATORY_SURGERY_CENTER): Payer: Self-pay

## 2019-11-08 VITALS — Ht 68.0 in | Wt 242.0 lb

## 2019-11-08 DIAGNOSIS — R112 Nausea with vomiting, unspecified: Secondary | ICD-10-CM

## 2019-11-08 NOTE — Progress Notes (Signed)
No egg or soy allergy known to patient  No issues with past sedation with any surgeries or procedures No intubation problems in the past  No FH of Malignant Hyperthermia No diet pills per patient No home 02 use per patient  No blood thinners per patient  Pt denies issues with constipation  No A fib or A flutter  EMMI video via MyChart  COVID 19 guidelines implemented in PV today with Pt and RN  COVID vaccines completed on 06/2019 per pt;  Due to the COVID-19 pandemic we are asking patients to follow these guidelines. Please only bring one care partner. Please be aware that your care partner may wait in the car in the parking lot or if they feel like they will be too hot to wait in the car, they may wait in the lobby on the 4th floor. All care partners are required to wear a mask the entire time (we do not have any that we can provide them), they need to practice social distancing, and we will do a Covid check for all patient's and care partners when you arrive. Also we will check their temperature and your temperature. If the care partner waits in their car they need to stay in the parking lot the entire time and we will call them on their cell phone when the patient is ready for discharge so they can bring the car to the front of the building. Also all patient's will need to wear a mask into building.  

## 2019-11-14 ENCOUNTER — Ambulatory Visit (AMBULATORY_SURGERY_CENTER): Payer: BC Managed Care – PPO | Admitting: Gastroenterology

## 2019-11-14 ENCOUNTER — Other Ambulatory Visit: Payer: Self-pay

## 2019-11-14 ENCOUNTER — Encounter: Payer: Self-pay | Admitting: Gastroenterology

## 2019-11-14 VITALS — BP 156/99 | HR 73 | Temp 98.4°F | Resp 12 | Ht 68.0 in | Wt 242.0 lb

## 2019-11-14 DIAGNOSIS — R112 Nausea with vomiting, unspecified: Secondary | ICD-10-CM

## 2019-11-14 DIAGNOSIS — K3189 Other diseases of stomach and duodenum: Secondary | ICD-10-CM | POA: Diagnosis not present

## 2019-11-14 DIAGNOSIS — R1013 Epigastric pain: Secondary | ICD-10-CM | POA: Diagnosis not present

## 2019-11-14 DIAGNOSIS — K2970 Gastritis, unspecified, without bleeding: Secondary | ICD-10-CM

## 2019-11-14 DIAGNOSIS — R109 Unspecified abdominal pain: Secondary | ICD-10-CM | POA: Diagnosis not present

## 2019-11-14 DIAGNOSIS — G8929 Other chronic pain: Secondary | ICD-10-CM | POA: Diagnosis not present

## 2019-11-14 MED ORDER — SODIUM CHLORIDE 0.9 % IV SOLN: 500.0000 mL | INTRAVENOUS | Status: DC

## 2019-11-14 NOTE — Progress Notes (Signed)
Called to room to assist during endoscopic procedure.  Patient ID and intended procedure confirmed with present staff. Received instructions for my participation in the procedure from the performing physician.  

## 2019-11-14 NOTE — Progress Notes (Signed)
Vs CW  Pt's states no medical or surgical changes since previsit or office visit.  

## 2019-11-14 NOTE — Patient Instructions (Signed)
Thank you for allowing [place] to care for you today.  Await biopsy results, approximately 7-10 days.  Resume previous diet and medications today.  Return to your normal activities tomorrow, 11/15/2019.     YOU HAD AN ENDOSCOPIC PROCEDURE TODAY AT THE [facility]:   Refer to the procedure report that was given to you for any specific questions about what was found during the examination.  If the procedure report does not answer your questions, please call your gastroenterologist to clarify.  If you requested that your care partner not be given the details of your procedure findings, then the procedure report has been included in a sealed envelope for you to review at your convenience later.  YOU SHOULD EXPECT: Some feelings of bloating in the abdomen. Passage of more gas than usual.  Walking can help get rid of the air that was put into your GI tract during the procedure and reduce the bloating. If you had a lower endoscopy (such as a colonoscopy or flexible sigmoidoscopy) you may notice spotting of blood in your stool or on the toilet paper. If you underwent a bowel prep for your procedure, you may not have a normal bowel movement for a few days.  Please Note:  You might notice some irritation and congestion in your nose or some drainage.  This is from the oxygen used during your procedure.  There is no need for concern and it should clear up in a day or so.  SYMPTOMS TO REPORT IMMEDIATELY:     Following upper endoscopy (EGD)  Vomiting of blood or coffee ground material  New chest pain or pain under the shoulder blades  Painful or persistently difficult swallowing  New shortness of breath  Fever of 100F or higher  Black, tarry-looking stools  For urgent or emergent issues, a gastroenterologist can be reached at any hour by calling [phone]. Do not use MyChart messaging for urgent concerns.    DIET:  We do recommend a small meal at first, but then you may proceed to your  regular diet.  Drink plenty of fluids but you should avoid alcoholic beverages for 24 hours.  ACTIVITY:  You should plan to take it easy for the rest of today and you should NOT DRIVE or use heavy machinery until tomorrow (because of the sedation medicines used during the test).    FOLLOW UP: Our staff will call the number listed on your records 48-72 hours following your procedure to check on you and address any questions or concerns that you may have regarding the information given to you following your procedure. If we do not reach you, we will leave a message.  We will attempt to reach you two times.  During this call, we will ask if you have developed any symptoms of COVID 19. If you develop any symptoms (ie: fever, flu-like symptoms, shortness of breath, cough etc.) before then, please call [phone].  If you test positive for Covid 19 in the 2 weeks post procedure, please call and report this information to [place].    If any biopsies were taken you will be contacted by phone or by letter within the next 1-3 weeks.  Please call us at [phone] if you have not heard about the biopsies in 3 weeks.    SIGNATURES/CONFIDENTIALITY: You and/or your care partner have signed paperwork which will be entered into your electronic medical record.  These signatures attest to the fact that that the information above on your After  Visit Summary has been reviewed and is understood.  Full responsibility of the confidentiality of this discharge information lies with you and/or your care-partner. °

## 2019-11-14 NOTE — Progress Notes (Signed)
PT taken to PACU. Monitors in place. VSS. Report given to RN. 

## 2019-11-14 NOTE — Op Note (Signed)
[facility] Patient Name: Hailey Bowman Procedure Date: 11/14/2019 10:39 AM MRN: [id number] Endoscopist: Napoleon Form , MD Age: 49 Referring MD:  Date of Birth: Jan 19, 1971 Gender: Female Account #: [id number] Procedure:                Upper GI endoscopy Indications:              Persistent vomiting of unknown cause, Epigastric                            abdominal pain Medicines:                Monitored Anesthesia Care Procedure:                Pre-Anesthesia Assessment:                           - Prior to the procedure, a History and Physical                            was performed, and patient medications and                            allergies were reviewed. The patient's tolerance of                            previous anesthesia was also reviewed. The risks                            and benefits of the procedure and the sedation                            options and risks were discussed with the patient.                            All questions were answered, and informed consent                            was obtained. Prior Anticoagulants: The patient has                            taken no previous anticoagulant or antiplatelet                            agents. ASA Grade Assessment: II - A patient with                            mild systemic disease. After reviewing the risks                            and benefits, the patient was deemed in                            satisfactory condition to undergo the procedure.  After obtaining informed consent, the endoscope was                            passed under direct vision. Throughout the                            procedure, the patient's blood pressure, pulse, and                            oxygen saturations were monitored continuously. The                            Endoscope was introduced through the mouth, and                            advanced to the second part of  duodenum. The upper                            GI endoscopy was accomplished without difficulty.                            The patient tolerated the procedure well. Scope In: Scope Out: Findings:                 No gross lesions were noted in the entire esophagus.                           The Z-line was regular and was found 38 cm from the                            incisors.                           Patchy mild inflammation characterized by                            congestion (edema) and erythema was found in the                            entire examined stomach. Biopsies were taken with a                            cold forceps for Helicobacter pylori testing.                           The cardia and gastric fundus were normal on                            retroflexion.                           The examined duodenum was normal. Complications:            No immediate complications. Estimated Blood Loss:     Estimated blood loss was minimal. Impression:               -  No gross lesions in esophagus.                           - Z-line regular, 38 cm from the incisors.                           - Gastritis. Biopsied.                           - Normal examined duodenum. Recommendation:           - Patient has a contact number available for                            emergencies. The signs and symptoms of potential                            delayed complications were discussed with the                            patient. Return to normal activities tomorrow.                            Written discharge instructions were provided to the                            patient.                           - Resume previous diet.                           - Continue present medications.                           - Await pathology results.                           - Return to GI office at the next available                            appointment in 2-3 months. Please call to schedule                             appointment. Napoleon Form, MD 11/14/2019 10:58:27 AM This report has been signed electronically.

## 2019-11-16 ENCOUNTER — Other Ambulatory Visit: Payer: Self-pay | Admitting: Physician Assistant

## 2019-11-16 ENCOUNTER — Telehealth: Payer: Self-pay

## 2019-11-16 NOTE — Telephone Encounter (Signed)
  Follow up Call-  Call back number 11/14/2019  Post procedure Call Back phone  # [phone]  Permission to leave phone message Yes  Some recent data might be hidden     Patient questions:  Do you have a fever, pain , or abdominal swelling? No. Pain Score  0 *  Have you tolerated food without any problems? Yes.    Have you been able to return to your normal activities? Yes.    Do you have any questions about your discharge instructions: Diet   No. Medications  No. Follow up visit  No.  Do you have questions or concerns about your Care? No.  Actions: * If pain score is 4 or above: No action needed, pain <4.  Pt states she vomited the day of her procedure after eating cereal and had abdominal pain.  States she is better now.  Advised to continue present meds and await pathology results.  Pt agreed.  1. Have you developed a fever since your procedure? no  2.   Have you had an respiratory symptoms (SOB or cough) since your procedure? no  3.   Have you tested positive for COVID 19 since your procedure no  4.   Have you had any family members/close contacts diagnosed with the COVID 19 since your procedure?  no   If yes to any of these questions please route to Laverna Peace, RN and Karlton Lemon, RN

## 2019-11-29 ENCOUNTER — Encounter: Payer: Self-pay | Admitting: Gastroenterology

## 2019-12-03 ENCOUNTER — Other Ambulatory Visit: Payer: Self-pay | Admitting: Physician Assistant

## 2019-12-10 ENCOUNTER — Other Ambulatory Visit: Payer: Self-pay | Admitting: Physician Assistant

## 2020-01-03 ENCOUNTER — Other Ambulatory Visit: Payer: Self-pay | Admitting: Physician Assistant

## 2020-01-13 ENCOUNTER — Other Ambulatory Visit: Payer: Self-pay | Admitting: Physician Assistant

## 2020-01-27 ENCOUNTER — Other Ambulatory Visit: Payer: Self-pay | Admitting: Physician Assistant

## 2020-02-13 ENCOUNTER — Other Ambulatory Visit: Payer: Self-pay | Admitting: Physician Assistant

## 2020-04-16 ENCOUNTER — Encounter: Payer: Self-pay | Admitting: Internal Medicine

## 2020-05-12 ENCOUNTER — Other Ambulatory Visit: Payer: Self-pay | Admitting: Physician Assistant

## 2020-11-13 ENCOUNTER — Other Ambulatory Visit: Payer: Self-pay | Admitting: Physician Assistant

## 2021-02-10 ENCOUNTER — Other Ambulatory Visit: Payer: Self-pay | Admitting: Physician Assistant

## 2022-10-10 ENCOUNTER — Emergency Department (HOSPITAL_COMMUNITY): Payer: 59

## 2022-10-10 ENCOUNTER — Emergency Department (HOSPITAL_COMMUNITY)
Admission: EM | Admit: 2022-10-10 | Discharge: 2022-10-10 | Disposition: A | Discharge status: Home / Self Care | Payer: 59 | Attending: Emergency Medicine | Admitting: Emergency Medicine

## 2022-10-10 ENCOUNTER — Other Ambulatory Visit: Payer: Self-pay

## 2022-10-10 DIAGNOSIS — R109 Unspecified abdominal pain: Secondary | ICD-10-CM | POA: Diagnosis present

## 2022-10-10 DIAGNOSIS — R1084 Generalized abdominal pain: Secondary | ICD-10-CM | POA: Diagnosis not present

## 2022-10-10 DIAGNOSIS — D72829 Elevated white blood cell count, unspecified: Secondary | ICD-10-CM | POA: Diagnosis not present

## 2022-10-10 DIAGNOSIS — E669 Obesity, unspecified: Secondary | ICD-10-CM | POA: Insufficient documentation

## 2022-10-10 DIAGNOSIS — Z6834 Body mass index (BMI) 34.0-34.9, adult: Secondary | ICD-10-CM | POA: Insufficient documentation

## 2022-10-10 LAB — URINALYSIS, ROUTINE W REFLEX MICROSCOPIC
Bacteria, UA: NONE SEEN
Bilirubin Urine: NEGATIVE
Glucose, UA: NEGATIVE mg/dL
Ketones, ur: 80 mg/dL — AB
Leukocytes,Ua: NEGATIVE
Nitrite: NEGATIVE
Protein, ur: 30 mg/dL — AB
Specific Gravity, Urine: >1.046 — ABNORMAL HIGH (ref 1.005–1.030)
pH: 8 (ref 5.0–8.0)

## 2022-10-10 LAB — CBC WITH DIFFERENTIAL/PLATELET
Abs Immature Granulocytes: 0.05 10*3/uL (ref 0.00–0.07)
Basophils Absolute: 0.1 10*3/uL (ref 0.0–0.1)
Basophils Relative: 1 %
Eosinophils Absolute: 0 10*3/uL (ref 0.0–0.5)
Eosinophils Relative: 0 %
HCT: 48.1 % — ABNORMAL HIGH (ref 36.0–46.0)
Hemoglobin: 16.1 g/dL — ABNORMAL HIGH (ref 12.0–15.0)
Immature Granulocytes: 0 %
Lymphocytes Relative: 10 %
Lymphs Abs: 1.3 10*3/uL (ref 0.7–4.0)
MCH: 29.5 pg (ref 26.0–34.0)
MCHC: 33.5 g/dL (ref 30.0–36.0)
MCV: 88.3 fL (ref 80.0–100.0)
Monocytes Absolute: 0.4 10*3/uL (ref 0.1–1.0)
Monocytes Relative: 3 %
Neutro Abs: 11.4 10*3/uL — ABNORMAL HIGH (ref 1.7–7.7)
Neutrophils Relative %: 86 %
Platelets: 308 10*3/uL (ref 150–400)
RBC: 5.45 MIL/uL — ABNORMAL HIGH (ref 3.87–5.11)
RDW: 14.1 % (ref 11.5–15.5)
WBC: 13.3 10*3/uL — ABNORMAL HIGH (ref 4.0–10.5)
nRBC: 0 % (ref 0.0–0.2)

## 2022-10-10 LAB — COMPREHENSIVE METABOLIC PANEL
ALT: 19 U/L (ref 0–44)
AST: 22 U/L (ref 15–41)
Albumin: 4.5 g/dL (ref 3.5–5.0)
Alkaline Phosphatase: 65 U/L (ref 38–126)
Anion gap: 17 — ABNORMAL HIGH (ref 5–15)
BUN: 14 mg/dL (ref 6–20)
CO2: 18 mmol/L — ABNORMAL LOW (ref 22–32)
Calcium: 9 mg/dL (ref 8.9–10.3)
Chloride: 105 mmol/L (ref 98–111)
Creatinine, Ser: 0.93 mg/dL (ref 0.44–1.00)
GFR, Estimated: >60 mL/min (ref >60)
Glucose, Bld: 142 mg/dL — ABNORMAL HIGH (ref 70–99)
Potassium: 3.3 mmol/L — ABNORMAL LOW (ref 3.5–5.1)
Sodium: 140 mmol/L (ref 135–145)
Total Bilirubin: 1.9 mg/dL — ABNORMAL HIGH (ref 0.3–1.2)
Total Protein: 8.6 g/dL — ABNORMAL HIGH (ref 6.5–8.1)

## 2022-10-10 LAB — LIPASE, BLOOD: Lipase: 27 U/L (ref 11–51)

## 2022-10-10 MED ORDER — ONDANSETRON HCL 4 MG/2ML IJ SOLN
4.0000 mg | Freq: Once | INTRAMUSCULAR | Status: AC
  Administered 2022-10-10: 4 mg via INTRAVENOUS
  Filled 2022-10-10: qty 2

## 2022-10-10 MED ORDER — HYDROCODONE-ACETAMINOPHEN 5-325 MG PO TABS: 1.0000 | ORAL_TABLET | ORAL | 0 refills | Status: DC | PRN

## 2022-10-10 MED ORDER — SUCRALFATE 1 GM/10ML PO SUSP: 1.0000 g | Freq: Three times a day (TID) | ORAL | 0 refills | Status: DC

## 2022-10-10 MED ORDER — HYDROMORPHONE HCL 1 MG/ML IJ SOLN
1.0000 mg | Freq: Once | INTRAMUSCULAR | Status: AC
  Administered 2022-10-10: 1 mg via INTRAVENOUS
  Filled 2022-10-10: qty 1

## 2022-10-10 MED ORDER — MORPHINE SULFATE (PF) 4 MG/ML IV SOLN
4.0000 mg | Freq: Once | INTRAVENOUS | Status: AC
  Administered 2022-10-10: 4 mg via INTRAVENOUS
  Filled 2022-10-10: qty 1

## 2022-10-10 MED ORDER — IOHEXOL 300 MG/ML  SOLN
100.0000 mL | Freq: Once | INTRAMUSCULAR | Status: AC | PRN
  Administered 2022-10-10: 100 mL via INTRAVENOUS

## 2022-10-10 MED ORDER — ALUM & MAG HYDROXIDE-SIMETH 200-200-20 MG/5ML PO SUSP
30.0000 mL | Freq: Once | ORAL | Status: AC
  Administered 2022-10-10: 30 mL via ORAL
  Filled 2022-10-10: qty 30

## 2022-10-10 MED ORDER — SODIUM CHLORIDE (PF) 0.9 % IJ SOLN
INTRAMUSCULAR | Status: AC
  Filled 2022-10-10: qty 50

## 2022-10-10 MED ORDER — ONDANSETRON 4 MG PO TBDP: 4.0000 mg | ORAL_TABLET | Freq: Three times a day (TID) | ORAL | 0 refills | Status: DC | PRN

## 2022-10-10 MED ORDER — OMEPRAZOLE 40 MG PO CPDR: 40.0000 mg | DELAYED_RELEASE_CAPSULE | Freq: Every morning | ORAL | 0 refills | Status: AC

## 2022-10-10 MED ORDER — SODIUM CHLORIDE 0.9 % IV BOLUS
1000.0000 mL | Freq: Once | INTRAVENOUS | Status: AC
  Administered 2022-10-10: 1000 mL via INTRAVENOUS

## 2022-10-10 MED ORDER — FAMOTIDINE IN NACL 20-0.9 MG/50ML-% IV SOLN
20.0000 mg | Freq: Once | INTRAVENOUS | Status: AC
  Administered 2022-10-10: 20 mg via INTRAVENOUS
  Filled 2022-10-10: qty 50

## 2022-10-10 NOTE — ED Provider Notes (Signed)
[place] EMERGENCY DEPARTMENT AT [facility] Provider Note   CSN: [id number] Arrival date & time: 10/10/22  1735     History  Chief Complaint  Patient presents with   Abdominal Pain    Hailey Bowman is a 53 y.o. female.  Pt is a 52 yo female with pmhx significant for depression, gerd, and depression.  Pt said she has been dealing with abd pain for the past 3 years.  She had her gb out and that did not help.  She is on prilosec.  She's seen gi and had an endoscopy which showed mild gastritis.  She used to smoke mj and was told to stop it.  She has and still has the pain.  She thinks she may have a sensitivity to gluten and has been avoiding it.  She did eat some today.  EMS gave her 200 mcg fentanyl en route and she is still having pain.  She denies vomiting or diarrhea.  No fever.       Home Medications Prior to Admission medications   Medication Sig Start Date End Date Taking? Authorizing Provider  buPROPion (WELLBUTRIN XL) 150 MG 24 hr tablet Take 450 mg by mouth daily.   Yes [provider]  calcium carbonate (OS-CAL) 1250 (500 Ca) MG chewable tablet Chew 1 tablet by mouth daily as needed for heartburn.   Yes [provider]  cetirizine (ZYRTEC) 10 MG tablet Take 10 mg by mouth at bedtime.   Yes [provider]  HYDROcodone-acetaminophen (NORCO/VICODIN) 5-325 MG tablet Take 1 tablet by mouth every 4 (four) hours as needed. 10/10/22  Yes Jacalyn Lefevre, MD  Magnesium 80 MG TABS Take 1 tablet by mouth daily.   Yes [provider]  ondansetron (ZOFRAN-ODT) 4 MG disintegrating tablet Take 1 tablet (4 mg total) by mouth every 8 (eight) hours as needed. 10/10/22  Yes Jacalyn Lefevre, MD  raNITIdine HCl (ACID CONTROL PO) Take 1 tablet by mouth daily as needed (acid control).   Yes [provider]  sertraline (ZOLOFT) 100 MG tablet Take 200 mg by mouth at bedtime.   Yes [provider]  albuterol (PROAIR HFA) 108  (90 BASE) MCG/ACT inhaler Inhale 2 puffs into the lungs every 6 (six) hours as needed for wheezing or shortness of breath. Patient not taking: Reported on 10/10/2022 01/10/15   Wanda Plump, MD  buPROPion [facility]) 100 MG tablet Take 1 tablet (100 mg total) by mouth 2 (two) times daily. Patient not taking: Reported on 10/10/2022 07/16/14   Wanda Plump, MD  hyoscyamine (LEVSIN SL) 0.125 MG SL tablet PLACE 1 TABLET UNDER THE TONGUE EVERY 6 (SIX) HOURS AS NEEDED (ABDOMINAL PAIN). Patient not taking: Reported on 10/10/2022 01/28/20   Esterwood, Amy S, PA-C  omeprazole (PRILOSEC) 40 MG capsule Take 1 capsule (40 mg total) by mouth in the morning. **PLEASE CONTACT OFFICE TO SCHEDULE FOLLOW UP 10/10/22   Jacalyn Lefevre, MD  phenazopyridine (PYRIDIUM) 200 MG tablet Take 1 tablet (200 mg total) by mouth 3 (three) times daily as needed for pain. Patient not taking: Reported on 11/14/2019 10/21/19   Palumbo, April, MD  sucralfate (CARAFATE) 1 GM/10ML suspension Take 10 mLs (1 g total) by mouth 4 (four) times daily -  with meals and at bedtime. 10/10/22   Jacalyn Lefevre, MD      Allergies    Ibuprofen and Penicillins    Review of Systems   Review of Systems  Gastrointestinal:  Positive for abdominal pain.  All other systems reviewed and are negative.   Physical Exam Updated Vital Signs BP (!) 139/93 (BP Location: Right Arm)   Pulse 89   Temp 98.8 F (37.1 C) (Oral)   Resp 18   Ht 5\' 8"  (1.727 m)   Wt 104.3 kg   SpO2 97%   BMI 34.97 kg/m  Physical Exam Vitals and nursing note reviewed.  Constitutional:      Appearance: She is well-developed. She is obese.  HENT:     Head: Normocephalic and atraumatic.     Mouth/Throat:     Mouth: Mucous membranes are moist.     Pharynx: Oropharynx is clear.  Eyes:     Extraocular Movements: Extraocular movements intact.     Pupils: Pupils are equal, round, and reactive to light.  Cardiovascular:     Rate and Rhythm: Normal rate and regular rhythm.   Pulmonary:     Effort: Pulmonary effort is normal.     Breath sounds: Normal breath sounds.  Abdominal:     General: Abdomen is flat. Bowel sounds are normal.     Palpations: Abdomen is soft.     Tenderness: There is abdominal tenderness in the epigastric area.  Skin:    General: Skin is warm.     Capillary Refill: Capillary refill takes less than 2 seconds.  Neurological:     General: No focal deficit present.     Mental Status: She is alert and oriented to person, place, and time.  Psychiatric:        Mood and Affect: Mood normal.        Behavior: Behavior normal.     ED Results / Procedures / Treatments   Labs (all labs ordered are listed, but only abnormal results are displayed) Labs Reviewed  CBC WITH DIFFERENTIAL/PLATELET - Abnormal; Notable for the following components:      Result Value   WBC 13.3 (*)    RBC 5.45 (*)    Hemoglobin 16.1 (*)    HCT 48.1 (*)    Neutro Abs 11.4 (*)    All other components within normal limits  COMPREHENSIVE METABOLIC PANEL - Abnormal; Notable for the following components:   Potassium 3.3 (*)    CO2 18 (*)    Glucose, Bld 142 (*)    Total Protein 8.6 (*)    Total Bilirubin 1.9 (*)    Anion gap 17 (*)    All other components within normal limits  URINALYSIS, ROUTINE W REFLEX MICROSCOPIC - Abnormal; Notable for the following components:   Specific Gravity, Urine >1.046 (*)    Hgb urine dipstick SMALL (*)    Ketones, ur 80 (*)    Protein, ur 30 (*)    All other components within normal limits  LIPASE, BLOOD  POC URINE PREG, ED    EKG None  Radiology CT ABDOMEN PELVIS W CONTRAST  Result Date: 10/10/2022 CLINICAL DATA:  Postprandial abdominal pain, nausea EXAM: CT ABDOMEN AND PELVIS WITH CONTRAST TECHNIQUE: Multidetector CT imaging of the abdomen and pelvis was performed using the standard protocol following bolus administration of intravenous contrast. RADIATION DOSE REDUCTION: This exam was performed according to the  departmental dose-optimization program which includes automated exposure control, adjustment of the mA and/or kV according to patient size and/or use of iterative reconstruction technique. CONTRAST:  OMNIPAQUE IOHEXOL 300 MG/ML  SOLN COMPARISON:  10/30/2019, 10/21/2019 FINDINGS: Lower chest: No acute pleural or parenchymal lung disease. Hepatobiliary: No focal liver abnormality is seen. Status post cholecystectomy. No  biliary dilatation. Pancreas: Unremarkable. No pancreatic ductal dilatation or surrounding inflammatory changes. Spleen: Normal in size without focal abnormality. Adrenals/Urinary Tract: Stable simple left renal cyst does not require follow-up. Kidneys enhance normally. No urinary tract calculi or obstructive uropathy. The adrenals and bladder are unremarkable. Stomach/Bowel: No bowel obstruction or ileus. Normal appendix right lower quadrant. No bowel wall thickening or inflammatory change. Vascular/Lymphatic: Aortic atherosclerosis. No enlarged abdominal or pelvic lymph nodes. Reproductive: Uterus and bilateral adnexa are unremarkable. Other: No free fluid or free intraperitoneal gas. No abdominal wall hernia. Musculoskeletal: No acute or destructive bony abnormalities. Reconstructed images demonstrate no additional findings. IMPRESSION: 1. No acute intra-abdominal or intrapelvic process. 2.  Aortic Atherosclerosis (ICD10-I70.0). Electronically Signed   By: Sharlet Salina M.D.   On: 10/10/2022 20:45    Procedures Procedures    Medications Ordered in ED Medications  HYDROmorphone (DILAUDID) injection 1 mg (has no administration in time range)  sodium chloride 0.9 % bolus 1,000 mL (0 mLs Intravenous Stopped 10/10/22 2055)  morphine (PF) 4 MG/ML injection 4 mg (4 mg Intravenous Given 10/10/22 1826)  ondansetron (ZOFRAN) injection 4 mg (4 mg Intravenous Given 10/10/22 1826)  HYDROmorphone (DILAUDID) injection 1 mg (1 mg Intravenous Given 10/10/22 1924)  iohexol (OMNIPAQUE) 300 MG/ML  solution 100 mL (100 mLs Intravenous Contrast Given 10/10/22 2027)  ondansetron (ZOFRAN) injection 4 mg (4 mg Intravenous Given 10/10/22 2131)  alum & mag hydroxide-simeth (MAALOX/MYLANTA) 200-200-20 MG/5ML suspension 30 mL (30 mLs Oral Given 10/10/22 2219)  famotidine (PEPCID) IVPB 20 mg premix (0 mg Intravenous Stopped 10/10/22 2249)    ED Course/ Medical Decision Making/ A&P                             Medical Decision Making Amount and/or Complexity of Data Reviewed Labs: ordered. Radiology: ordered.  Risk OTC drugs. Prescription drug management.   This patient presents to the ED for concern of abd pain, this involves an extensive number of treatment options, and is a complaint that carries with it a high risk of complications and morbidity.  The differential diagnosis includes pancreatitis, appendicitis, ibd   Co morbidities that complicate the patient evaluation  depression, gerd, and depression   Additional history obtained:  Additional history obtained from epic chart review External records from outside source obtained and reviewed including EMS report   Lab Tests:  I Ordered, and personally interpreted labs.  The pertinent results include:  cbc with wbc elevated at 13.3, cmp nl, ua + ketones/protein; no uti   Imaging Studies ordered:  I ordered imaging studies including ct  I independently visualized and interpreted imaging which showed  No acute intra-abdominal or intrapelvic process.  2.  Aortic Atherosclerosis (ICD10-I70.0).   I agree with the radiologist interpretation   Cardiac Monitoring:  The patient was maintained on a cardiac monitor.  I personally viewed and interpreted the cardiac monitored which showed an underlying rhythm of: nsr   Medicines ordered and prescription drug management:  I ordered medication including morphine/dilaudid/zofran  for sx  Reevaluation of the patient after these medicines showed that the patient improved I have  reviewed the patients home medicines and have made adjustments as needed   Test Considered:  ct   Critical Interventions:  Pain control  Problem List / ED Course:  Abd pain:  no etiology found.  Pt is feeling much better after treatment.  However, pain came back after eating.  So, she's given pepcid and maalox.  Pt is stable for d/c.  She is given a rx for a higher dose prilosec and carafate.  Return if worse.   Reevaluation:  After the interventions noted above, I reevaluated the patient and found that they have :improved   Social Determinants of Health:  Lives at home   Dispostion:  After consideration of the diagnostic results and the patients response to treatment, I feel that the patent would benefit from discharge with outpatient f/u.          Final Clinical Impression(s) / ED Diagnoses Final diagnoses:  Generalized abdominal pain    Rx / DC Orders ED Discharge Orders          Ordered    ondansetron (ZOFRAN-ODT) 4 MG disintegrating tablet  Every 8 hours PRN        10/10/22 2248    HYDROcodone-acetaminophen (NORCO/VICODIN) 5-325 MG tablet  Every 4 hours PRN        10/10/22 2248    sucralfate (CARAFATE) 1 GM/10ML suspension  3 times daily with meals & bedtime        10/10/22 2249    omeprazole (PRILOSEC) 40 MG capsule  Every morning        10/10/22 2249              Jacalyn Lefevre, MD 10/10/22 2252

## 2022-10-10 NOTE — ED Notes (Signed)
Pt educated on DC instructions , verbalizes understanding , pt appears to be in NAD at time of DC, pt husband taking her home

## 2022-10-10 NOTE — ED Notes (Signed)
Pt aware urine sample needed, pt attempted but was unsuccessful will attempted again later.

## 2022-10-10 NOTE — ED Triage Notes (Signed)
Patient brought in by EMS from home with c/o ABD pain. Per patient pain increases when she eats food with gluten. She c/o some nausea but no vomiting and no diarrhea. ABD is tender to touch. EMS started a 20g IV in L hand and gave 200 of fentanyl. 182/100 87 97% RA

## 2022-10-12 ENCOUNTER — Other Ambulatory Visit: Payer: Self-pay

## 2022-10-12 ENCOUNTER — Emergency Department (HOSPITAL_COMMUNITY)
Admission: EM | Admit: 2022-10-12 | Discharge: 2022-10-12 | Disposition: A | Discharge status: Home / Self Care | Payer: 59 | Attending: Emergency Medicine | Admitting: Emergency Medicine

## 2022-10-12 DIAGNOSIS — R109 Unspecified abdominal pain: Secondary | ICD-10-CM | POA: Diagnosis present

## 2022-10-12 DIAGNOSIS — J45909 Unspecified asthma, uncomplicated: Secondary | ICD-10-CM | POA: Diagnosis not present

## 2022-10-12 DIAGNOSIS — R1084 Generalized abdominal pain: Secondary | ICD-10-CM | POA: Diagnosis not present

## 2022-10-12 LAB — URINALYSIS, ROUTINE W REFLEX MICROSCOPIC
Bilirubin Urine: NEGATIVE
Glucose, UA: NEGATIVE mg/dL
Ketones, ur: 80 mg/dL — AB
Nitrite: NEGATIVE
Protein, ur: 30 mg/dL — AB
Specific Gravity, Urine: 1.025 (ref 1.005–1.030)
pH: 5 (ref 5.0–8.0)

## 2022-10-12 LAB — COMPREHENSIVE METABOLIC PANEL
ALT: 33 U/L (ref 0–44)
AST: 36 U/L (ref 15–41)
Albumin: 4 g/dL (ref 3.5–5.0)
Alkaline Phosphatase: 65 U/L (ref 38–126)
Anion gap: 17 — ABNORMAL HIGH (ref 5–15)
BUN: 16 mg/dL (ref 6–20)
CO2: 17 mmol/L — ABNORMAL LOW (ref 22–32)
Calcium: 8.3 mg/dL — ABNORMAL LOW (ref 8.9–10.3)
Chloride: 102 mmol/L (ref 98–111)
Creatinine, Ser: 0.81 mg/dL (ref 0.44–1.00)
GFR, Estimated: >60 mL/min (ref >60)
Glucose, Bld: 94 mg/dL (ref 70–99)
Potassium: 3.2 mmol/L — ABNORMAL LOW (ref 3.5–5.1)
Sodium: 136 mmol/L (ref 135–145)
Total Bilirubin: 2.3 mg/dL — ABNORMAL HIGH (ref 0.3–1.2)
Total Protein: 7.7 g/dL (ref 6.5–8.1)

## 2022-10-12 LAB — CBC WITH DIFFERENTIAL/PLATELET
Abs Immature Granulocytes: 0.07 10*3/uL (ref 0.00–0.07)
Basophils Absolute: 0 10*3/uL (ref 0.0–0.1)
Basophils Relative: 0 %
Eosinophils Absolute: 0 10*3/uL (ref 0.0–0.5)
Eosinophils Relative: 0 %
HCT: 42.6 % (ref 36.0–46.0)
Hemoglobin: 14 g/dL (ref 12.0–15.0)
Immature Granulocytes: 1 %
Lymphocytes Relative: 13 %
Lymphs Abs: 1.8 10*3/uL (ref 0.7–4.0)
MCH: 29.7 pg (ref 26.0–34.0)
MCHC: 32.9 g/dL (ref 30.0–36.0)
MCV: 90.3 fL (ref 80.0–100.0)
Monocytes Absolute: 0.8 10*3/uL (ref 0.1–1.0)
Monocytes Relative: 5 %
Neutro Abs: 11.5 10*3/uL — ABNORMAL HIGH (ref 1.7–7.7)
Neutrophils Relative %: 81 %
Platelets: 258 10*3/uL (ref 150–400)
RBC: 4.72 MIL/uL (ref 3.87–5.11)
RDW: 14.3 % (ref 11.5–15.5)
WBC: 14.2 10*3/uL — ABNORMAL HIGH (ref 4.0–10.5)
nRBC: 0 % (ref 0.0–0.2)

## 2022-10-12 LAB — HCG, SERUM, QUALITATIVE: Preg, Serum: NEGATIVE

## 2022-10-12 LAB — LIPASE, BLOOD: Lipase: 27 U/L (ref 11–51)

## 2022-10-12 MED ORDER — HYDROMORPHONE HCL 1 MG/ML IJ SOLN
1.0000 mg | Freq: Once | INTRAMUSCULAR | Status: AC
  Administered 2022-10-12: 1 mg via INTRAVENOUS
  Filled 2022-10-12: qty 1

## 2022-10-12 MED ORDER — HALOPERIDOL LACTATE 5 MG/ML IJ SOLN
3.0000 mg | Freq: Once | INTRAMUSCULAR | Status: AC
  Administered 2022-10-12: 3 mg via INTRAVENOUS
  Filled 2022-10-12: qty 1

## 2022-10-12 MED ORDER — HALOPERIDOL LACTATE 5 MG/ML IJ SOLN
2.0000 mg | Freq: Once | INTRAMUSCULAR | Status: AC | PRN
  Administered 2022-10-12: 2 mg via INTRAVENOUS

## 2022-10-12 MED ORDER — LACTATED RINGERS IV BOLUS
1000.0000 mL | Freq: Once | INTRAVENOUS | Status: AC
  Administered 2022-10-12: 1000 mL via INTRAVENOUS

## 2022-10-12 NOTE — Discharge Instructions (Signed)
We saw you in the ER for abdominal discomfort. ER RESULTS ARE NORMAL/REASSURING.   Symptoms can evolve, therefore please return to the ER if you have increased pain, fevers, chills, inability to keep any medications down, confusion, sweating. Otherwise see your primary care doctor in 2-3 days for further evaluation.

## 2022-10-12 NOTE — ED Triage Notes (Signed)
Pt reports abd pain that started this morning. Also c/o dizziness and nausea. Pt was seen 2 days ago for same.

## 2022-10-12 NOTE — ED Provider Notes (Addendum)
Hailey Bowman   CSN: [id number] Arrival date & time: 10/12/22  1305     History  Chief Complaint  Patient presents with   Abdominal Pain    Hailey Bowman is a 52 y.o. female.  HPI    52 year old female comes in with chief complaint of abdominal pain. She has history of anxiety, asthma, tobacco use disorder, cholecystitis and chronic abdominal pain.  Patient indicates that she will have upper quadrant abdominal pain once every couple of weeks.  Typically the pain will improve with warm showers.  On Friday she started having the abdominal pain, it was severe and not responding to the showers therefore she came to the ER.  She had CT scan which was negative.  She received Dilaudid and felt better.  She went home and was doing well on Saturday, but her pain started again on Sunday.  Since then she has been having fairly constant pain -and this morning it is worsening therefore she called her husband to bring her to the emergency room.  Patient also indicates that she is feeling tingling sensation everywhere and feeling weak in her extremities.  She is also having some tetany with blood pressure cuff.  Patient indicates that she used to smoke marijuana, now she vapes occasionally. No other substance use.   Home Medications Prior to Admission medications   Medication Sig Start Date End Date Taking? Authorizing Provider  albuterol (PROAIR HFA) 108 (90 BASE) MCG/ACT inhaler Inhale 2 puffs into the lungs every 6 (six) hours as needed for wheezing or shortness of breath. Patient not taking: Reported on 10/10/2022 01/10/15   Wanda Plump, MD  buPROPion (WELLBUTRIN XL) 150 MG 24 hr tablet Take 450 mg by mouth daily.    [provider]  buPROPion (WELLBUTRIN) 100 MG tablet Take 1 tablet (100 mg total) by mouth 2 (two) times daily. Patient not taking: Reported on 10/10/2022 07/16/14   Wanda Plump, MD  calcium carbonate  (OS-CAL) 1250 (500 Ca) MG chewable tablet Chew 1 tablet by mouth daily as needed for heartburn.    [provider]  cetirizine (ZYRTEC) 10 MG tablet Take 10 mg by mouth at bedtime.    [provider]  HYDROcodone-acetaminophen (NORCO/VICODIN) 5-325 MG tablet Take 1 tablet by mouth every 4 (four) hours as needed. 10/10/22   Jacalyn Lefevre, MD  hyoscyamine (LEVSIN SL) 0.125 MG SL tablet PLACE 1 TABLET UNDER THE TONGUE EVERY 6 (SIX) HOURS AS NEEDED (ABDOMINAL PAIN). Patient not taking: Reported on 10/10/2022 01/28/20   Sammuel Cooper, PA-C  Magnesium 80 MG TABS Take 1 tablet by mouth daily.    [provider]  omeprazole (PRILOSEC) 40 MG capsule Take 1 capsule (40 mg total) by mouth in the morning. **PLEASE CONTACT OFFICE TO SCHEDULE FOLLOW UP 10/10/22   Jacalyn Lefevre, MD  ondansetron (ZOFRAN-ODT) 4 MG disintegrating tablet Take 1 tablet (4 mg total) by mouth every 8 (eight) hours as needed. 10/10/22   Jacalyn Lefevre, MD  phenazopyridine (PYRIDIUM) 200 MG tablet Take 1 tablet (200 mg total) by mouth 3 (three) times daily as needed for pain. Patient not taking: Reported on 11/14/2019 10/21/19   Palumbo, April, MD  raNITIdine HCl (ACID CONTROL PO) Take 1 tablet by mouth daily as needed (acid control).    [provider]  sertraline (ZOLOFT) 100 MG tablet Take 200 mg by mouth at bedtime.    [provider]  sucralfate (CARAFATE) 1 GM/10ML  suspension Take 10 mLs (1 g total) by mouth 4 (four) times daily -  with meals and at bedtime. 10/10/22   Jacalyn Lefevre, MD      Allergies    Ibuprofen and Penicillins    Review of Systems   Review of Systems  All other systems reviewed and are negative.   Physical Exam Updated Vital Signs BP (!) 157/84   Pulse 75   Temp 97.8 F (36.6 C) (Oral)   Resp 12   Ht 5\' 8"  (1.727 m)   Wt 104 kg   SpO2 98%   BMI 34.86 kg/m  Physical Exam Vitals and nursing Bowman reviewed.  Constitutional:      Appearance: She is  well-developed.     Comments: Hyperventilating patient  HENT:     Head: Atraumatic.  Cardiovascular:     Rate and Rhythm: Normal rate.  Pulmonary:     Effort: Pulmonary effort is normal.  Abdominal:     Tenderness: There is generalized abdominal tenderness and tenderness in the right upper quadrant, epigastric area and left upper quadrant. There is no guarding or rebound.  Musculoskeletal:     Cervical back: Normal range of motion and neck supple.  Skin:    General: Skin is warm and dry.  Neurological:     Mental Status: She is alert and oriented to person, place, and time.     ED Results / Procedures / Treatments   Labs (all labs ordered are listed, but only abnormal results are displayed) Labs Reviewed  CBC WITH DIFFERENTIAL/PLATELET - Abnormal; Notable for the following components:      Result Value   WBC 14.2 (*)    Neutro Abs 11.5 (*)    All other components within normal limits  COMPREHENSIVE METABOLIC PANEL - Abnormal; Notable for the following components:   Potassium 3.2 (*)    CO2 17 (*)    Calcium 8.3 (*)    Total Bilirubin 2.3 (*)    Anion gap 17 (*)    All other components within normal limits  HCG, SERUM, QUALITATIVE  LIPASE, BLOOD  URINALYSIS, ROUTINE W REFLEX MICROSCOPIC    EKG EKG Interpretation Date/Time:  Tuesday October 12 2022 13:42:45 EDT Ventricular Rate:  78 PR Interval:  137 QRS Duration:  86 QT Interval:  436 QTC Calculation: 497 R Axis:   70  Text Interpretation: Sinus rhythm Right atrial enlargement Minimal ST depression, anterolateral leads Borderline prolonged QT interval No acute changes Confirmed by Derwood Kaplan [phone]) on 10/12/2022 1:51:16 PM  Radiology CT ABDOMEN PELVIS W CONTRAST  Result Date: 10/10/2022 CLINICAL DATA:  Postprandial abdominal pain, nausea EXAM: CT ABDOMEN AND PELVIS WITH CONTRAST TECHNIQUE: Multidetector CT imaging of the abdomen and pelvis was performed using the standard protocol following bolus administration  of intravenous contrast. RADIATION DOSE REDUCTION: This exam was performed according to the departmental dose-optimization program which includes automated exposure control, adjustment of the mA and/or kV according to patient size and/or use of iterative reconstruction technique. CONTRAST:  OMNIPAQUE IOHEXOL 300 MG/ML  SOLN COMPARISON:  10/30/2019, 10/21/2019 FINDINGS: Lower chest: No acute pleural or parenchymal lung disease. Hepatobiliary: No focal liver abnormality is seen. Status post cholecystectomy. No biliary dilatation. Pancreas: Unremarkable. No pancreatic ductal dilatation or surrounding inflammatory changes. Spleen: Normal in size without focal abnormality. Adrenals/Urinary Tract: Stable simple left renal cyst does not require follow-up. Kidneys enhance normally. No urinary tract calculi or obstructive uropathy. The adrenals and bladder are unremarkable. Stomach/Bowel: No bowel obstruction or ileus. Normal  appendix right lower quadrant. No bowel wall thickening or inflammatory change. Vascular/Lymphatic: Aortic atherosclerosis. No enlarged abdominal or pelvic lymph nodes. Reproductive: Uterus and bilateral adnexa are unremarkable. Other: No free fluid or free intraperitoneal gas. No abdominal wall hernia. Musculoskeletal: No acute or destructive bony abnormalities. Reconstructed images demonstrate no additional findings. IMPRESSION: 1. No acute intra-abdominal or intrapelvic process. 2.  Aortic Atherosclerosis (ICD10-I70.0). Electronically Signed   By: Sharlet Salina M.D.   On: 10/10/2022 20:45    Procedures Procedures    Medications Ordered in ED Medications  haloperidol lactate (HALDOL) injection 3 mg (3 mg Intravenous Given 10/12/22 1433)  lactated ringers bolus 1,000 mL (1,000 mLs Intravenous New Bag/Given 10/12/22 1433)  haloperidol lactate (HALDOL) injection 2 mg (2 mg Intravenous Given 10/12/22 1512)  HYDROmorphone (DILAUDID) injection 1 mg (1 mg Intravenous Given 10/12/22 1557)     ED Course/ Medical Decision Making/ A&P Clinical Course as of 10/12/22 1729  Tue Oct 12, 2022  1729 Patient reassessed.  She feels a lot better now. Her somatic symptoms have resolved. Haldol followed by Dilaudid held.  She is comfortable going home at this time. Strict ER return precautions have been discussed, and patient is agreeing with the plan and is comfortable with the workup done and the recommendations from the ER.  [AN]    Clinical Course User Index [AN] Derwood Kaplan, MD                                 Medical Decision Making Risk Prescription drug management.   52 year old patient comes in with chief complaint of abdominal pain.  She is also reporting generalized weakness, numbness and according to the nursing staff, patient had some tetany with blood pressure cuff tightening.  Patient is hyperventilating, her systemic symptoms of tingling sensation, weakness and tetany are all probably because of her hyperventilation.  I have given her emesis bag and asked her to breathe in it.  Abdominal pain is generally chronic in nature, she had a recent CT scan that was negative.  Differential diagnosis for this pain includes spasmodic pain, gastroparesis, ileus.  My plan is to give her some Haldol right now and then we will reassess her. EKG shows no QT prolongation.  Final Clinical Impression(s) / ED Diagnoses Final diagnoses:  Generalized abdominal pain    Rx / DC Orders ED Discharge Orders     None         Derwood Kaplan, MD 10/12/22 1443    Derwood Kaplan, MD 10/12/22 1729

## 2022-10-12 NOTE — ED Provider Triage Note (Signed)
Emergency Medicine Provider Triage Evaluation Note  AYRIAH KOZIARA , a 52 y.o. female  was evaluated in triage.  Pt complains of upper abdominal pain associated with numbness/tingling to extremities and now weakness to bilateral lower extremities. No CP. Admits to SOB. Seen 2 days ago for same with reassuring work-up. Normal CT abdomen. Admits to nausea, no vomiting. Decreased po intake.   Review of Systems  Positive: Abdominal pain Negative: vomiting  Physical Exam  BP (!) 180/99 (BP Location: Right Arm)   Pulse 86   Temp 98 F (36.7 C) (Oral)   Resp 20   Ht 5\' 8"  (1.727 m)   Wt 104 kg   SpO2 98%   BMI 34.86 kg/m  Gen:   Awake, no distress   Resp:  Normal effort  MSK:   Moves extremities without difficulty  Other:    Medical Decision Making  Medically screening exam initiated at 1:30 PM.  Appropriate orders placed.  BETHANN KAWA was informed that the remainder of the evaluation will be completed by another provider, this initial triage assessment does not replace that evaluation, and the importance of remaining in the ED until their evaluation is complete.  Brought to room from triage for further work-up.    Mannie Stabile, [place] 10/12/22 1331

## 2022-10-12 NOTE — ED Notes (Signed)
Pt states her pain is coming back. Pt was d/c. Dr. Rush Landmark notified and states pt is safe for d/c. Pt advised to follow up with PCP and GI for symptoms

## 2022-10-12 NOTE — ED Notes (Signed)
Pt requesting pain medication. Dr. Rhunette Croft notified.

## 2024-01-21 ENCOUNTER — Ambulatory Visit
Admission: RE | Admit: 2024-01-21 | Discharge: 2024-01-21 | Disposition: A | Discharge status: Home / Self Care | Source: Ambulatory Visit | Attending: Family Medicine | Admitting: Family Medicine

## 2024-01-21 VITALS — BP 159/109 | HR 89 | Temp 98.6°F | Resp 17

## 2024-01-21 DIAGNOSIS — R1013 Epigastric pain: Secondary | ICD-10-CM | POA: Diagnosis not present

## 2024-01-21 MED ORDER — DICYCLOMINE HCL 20 MG PO TABS: 20.0000 mg | ORAL_TABLET | Freq: Four times a day (QID) | ORAL | 0 refills | Status: AC | PRN

## 2024-01-21 MED ORDER — SUCRALFATE 1 G PO TABS: 1.0000 g | ORAL_TABLET | Freq: Two times a day (BID) | ORAL | 0 refills | Status: AC

## 2024-01-21 NOTE — Discharge Instructions (Signed)
 Dicyclomine --take 1 every 6 hours as needed for intestinal cramps  Sucralfate  1 g--1 tablet twice daily.  Continue your omeprazole  Follow-up with your primary care  If you worsen and are not able to keep food down or your pain becomes more intense, please consider going to emergency room for further evaluation

## 2024-01-21 NOTE — ED Triage Notes (Addendum)
 Pt states she has had chronic GI issues for about 5 years.  Presents today due upper abdominal pain that feels like burning. She had this pain earlier today

## 2024-01-21 NOTE — ED Provider Notes (Signed)
 [facility]    CSN: [id number] Arrival date & time: 01/21/24  1424      History   Chief Complaint Chief Complaint  Patient presents with   Abdominal Pain    Possible dehydration - Entered by patient    HPI Hailey Bowman is a 53 y.o. female.    Abdominal Pain  Here for trouble with upper epigastric pain that has been bothering her in the last week.  Whenever she has a bowel movement it will get worse and she has to get it to calm down by getting in the shower.  In between bowel movements it will be a dull ache.  She has been eating very light, such things as applesauce and apple juice.  She did have a normal bowel movement this morning, but did not cause more pain.  Some nausea off and on.  No vomiting.  Bowel movements are normal brown  She has had such symptoms for about 5 years.  I can see 2 instances of her having endoscopy and biopsies that were negative for H. pylori.  She is taking omeprazole  daily 40 mg.  She is allergic to ibuprofen and penicillin Past Medical History:  Diagnosis Date   Allergy    seaonal allergies   Anxiety    on meds   Asthma    childhood   Contraceptive use    condoms   Depression    on meds   Gallstones    cholecystectomy in 09/2019   GERD (gastroesophageal reflux disease)    on meds   History of UTI    as a child     Patient Active Problem List   Diagnosis Date Noted   Acute cholecystitis 09/20/2019   Allergic rhinitis 08/23/2013   Otitis media 03/09/2012   Tobacco abuse 03/09/2012   Asthma    Anxiety state 11/30/2006    Past Surgical History:  Procedure Laterality Date   ANKLE SURGERY Left    BLADDER SURGERY     as a child x 2   CHOLECYSTECTOMY N/A 09/20/2019   Procedure: LAPAROSCOPIC CHOLECYSTECTOMY WITH INTRAOPERATIVE CHOLANGIOGRAM;  Surgeon: Sebastian Moles, MD;  Location: [facility] OR;  Service: General;  Laterality: N/A;   DILATION AND CURETTAGE OF UTERUS     WISDOM TOOTH EXTRACTION      OB  History   No obstetric history on file.      Home Medications    Prior to Admission medications   Medication Sig Start Date End Date Taking? Authorizing Provider  dicyclomine  (BENTYL ) 20 MG tablet Take 1 tablet (20 mg total) by mouth 4 (four) times daily as needed (intestinal cramps). 01/21/24  Yes Vonna Sharlet POUR, MD  sucralfate  (CARAFATE ) 1 g tablet Take 1 tablet (1 g total) by mouth 2 (two) times daily. 01/21/24  Yes Vonna Sharlet POUR, MD  albuterol  (PROAIR  HFA) 108 (90 BASE) MCG/ACT inhaler Inhale 2 puffs into the lungs every 6 (six) hours as needed for wheezing or shortness of breath. Patient not taking: Reported on 10/10/2022 01/10/15   Paz, Jose E, MD  buPROPion  (WELLBUTRIN  XL) 150 MG 24 hr tablet Take 450 mg by mouth daily.    [provider]  buPROPion  (WELLBUTRIN ) 100 MG tablet Take 1 tablet (100 mg total) by mouth 2 (two) times daily. Patient not taking: Reported on 10/10/2022 07/16/14   Paz, Jose E, MD  calcium carbonate (OS-CAL) 1250 (500 Ca) MG chewable tablet Chew 1 tablet by mouth daily as needed for heartburn.  [provider]  cetirizine (ZYRTEC) 10 MG tablet Take 10 mg by mouth at bedtime.    [provider]  Magnesium 80 MG TABS Take 1 tablet by mouth daily.    [provider]  omeprazole  (PRILOSEC) 40 MG capsule Take 1 capsule (40 mg total) by mouth in the morning. **PLEASE CONTACT OFFICE TO SCHEDULE FOLLOW UP 10/10/22   Dean Clarity, MD  raNITIdine HCl (ACID CONTROL PO) Take 1 tablet by mouth daily as needed (acid control).    [provider]    Family History Family History  Problem Relation Age of Onset   Breast cancer Mother    Heart disease Maternal Grandmother    Diabetes Maternal Grandfather    Heart disease Maternal Grandfather    Colon cancer Neg Hx    Colon polyps Neg Hx    Esophageal cancer Neg Hx    Rectal cancer Neg Hx    Stomach cancer Neg Hx     Social History Social History   Tobacco Use    Smoking status: Some Days    Current packs/day: 1.00    Types: Cigarettes   Smokeless tobacco: Never   Tobacco comments:    1 ppd  Vaping Use   Vaping status: Some Days   Substances: CBD  Substance Use Topics   Alcohol use: Not Currently   Drug use: Not Currently    Types: Marijuana    Comment: last use 10/21/2019     Allergies   Ibuprofen and Penicillins   Review of Systems Review of Systems  Gastrointestinal:  Positive for abdominal pain.     Physical Exam Triage Vital Signs ED Triage Vitals  Encounter Vitals Group     BP 01/21/24 1444 (!) 159/109     Girls Systolic BP Percentile --      Girls Diastolic BP Percentile --      Boys Systolic BP Percentile --      Boys Diastolic BP Percentile --      Pulse Rate 01/21/24 1444 89     Resp 01/21/24 1444 17     Temp 01/21/24 1444 98.6 F (37 C)     Temp Source 01/21/24 1443 Oral     SpO2 01/21/24 1444 98 %     Weight --      Height --      Head Circumference --      Peak Flow --      Pain Score 01/21/24 1442 10     Pain Loc --      Pain Education --      Exclude from Growth Chart --    No data found.  Updated Vital Signs BP (!) 159/109 (BP Location: Right Arm)   Pulse 89   Temp 98.6 F (37 C) (Oral)   Resp 17   SpO2 98%   Visual Acuity Right Eye Distance:   Left Eye Distance:   Bilateral Distance:    Right Eye Near:   Left Eye Near:    Bilateral Near:     Physical Exam Vitals reviewed.  Constitutional:      General: She is not in acute distress.    Appearance: She is not toxic-appearing.  HENT:     Mouth/Throat:     Mouth: Mucous membranes are moist.  Eyes:     Extraocular Movements: Extraocular movements intact.     Conjunctiva/sclera: Conjunctivae normal.     Pupils: Pupils are equal, round, and reactive to light.  Cardiovascular:  Rate and Rhythm: Normal rate and regular rhythm.     Heart sounds: No murmur heard. Pulmonary:     Effort: Pulmonary effort is normal.     Breath  sounds: Normal breath sounds.  Abdominal:     General: There is no distension.     Palpations: Abdomen is soft.     Tenderness: There is no guarding.     Comments: There is mild tenderness in the upper epigastrium.  Musculoskeletal:     Cervical back: Neck supple.  Lymphadenopathy:     Cervical: No cervical adenopathy.  Skin:    Coloration: Skin is not jaundiced or pale.  Neurological:     General: No focal deficit present.     Mental Status: She is alert and oriented to person, place, and time.  Psychiatric:        Behavior: Behavior normal.      UC Treatments / Results  Labs (all labs ordered are listed, but only abnormal results are displayed) Labs Reviewed - No data to display  EKG   Radiology No results found.  Procedures Procedures (including critical care time)  Medications Ordered in UC Medications - No data to display  Initial Impression / Assessment and Plan / UC Course  I have reviewed the triage vital signs and the nursing notes.  Pertinent labs & imaging results that were available during my care of the patient were reviewed by me and considered in my medical decision making (see chart for details).     She will continue omeprazole .  I sent in Carafate  to add onto that for possible reflux problems as she is burping a little extra, though that could be due to the things she has been taking in the last week.  Bentyl  was sent in for anything that might be abdominal cramps.  She will follow-up with her primary care   she will go to the emergency room if she worsens anyway  Final Clinical Impressions(s) / UC Diagnoses   Final diagnoses:  Abdominal pain, epigastric     Discharge Instructions      Dicyclomine --take 1 every 6 hours as needed for intestinal cramps  Sucralfate  1 g--1 tablet twice daily.  Continue your omeprazole  Follow-up with your primary care  If you worsen and are not able to keep food down or your pain becomes more intense,  please consider going to emergency room for further evaluation     ED Prescriptions     Medication Sig Dispense Auth. Provider   dicyclomine  (BENTYL ) 20 MG tablet Take 1 tablet (20 mg total) by mouth 4 (four) times daily as needed (intestinal cramps). 30 tablet Vonna Sharlet POUR, MD   sucralfate  (CARAFATE ) 1 g tablet Take 1 tablet (1 g total) by mouth 2 (two) times daily. 60 tablet Samirah Scarpati, Sharlet POUR, MD      PDMP not reviewed this encounter.   Vonna Sharlet POUR, MD 01/21/24 [id number]
